# Patient Record
Sex: Male | Born: 1985 | Race: White | Hispanic: No | Marital: Single | Smoking: Current every day smoker
Health system: Southern US, Community
[De-identification: ages and names within clinical notes are randomized; demographics above are authoritative.]

## PROBLEM LIST (undated history)

## (undated) HISTORY — PX: NO PAST SURGERIES: SHX2092

---
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
## 2004-12-07 ENCOUNTER — Ambulatory Visit: Payer: Self-pay | Admitting: Internal Medicine

## 2005-01-16 ENCOUNTER — Emergency Department (HOSPITAL_COMMUNITY): Admission: EM | Admit: 2005-01-16 | Discharge: 2005-01-16 | Payer: Self-pay | Admitting: Emergency Medicine

## 2005-02-14 ENCOUNTER — Emergency Department (HOSPITAL_COMMUNITY): Admission: EM | Admit: 2005-02-14 | Discharge: 2005-02-14 | Payer: Self-pay | Admitting: Emergency Medicine

## 2005-08-20 ENCOUNTER — Emergency Department (HOSPITAL_COMMUNITY): Admission: EM | Admit: 2005-08-20 | Discharge: 2005-08-20 | Payer: Self-pay | Admitting: Emergency Medicine

## 2005-08-28 ENCOUNTER — Emergency Department (HOSPITAL_COMMUNITY): Admission: EM | Admit: 2005-08-28 | Discharge: 2005-08-29 | Payer: Self-pay | Admitting: Emergency Medicine

## 2005-09-01 ENCOUNTER — Emergency Department (HOSPITAL_COMMUNITY): Admission: EM | Admit: 2005-09-01 | Discharge: 2005-09-01 | Payer: Self-pay | Admitting: Family Medicine

## 2005-09-03 ENCOUNTER — Emergency Department (HOSPITAL_COMMUNITY): Admission: EM | Admit: 2005-09-03 | Discharge: 2005-09-03 | Payer: Self-pay | Admitting: Family Medicine

## 2006-07-04 ENCOUNTER — Emergency Department (HOSPITAL_COMMUNITY): Admission: EM | Admit: 2006-07-04 | Discharge: 2006-07-04 | Payer: Self-pay | Admitting: Emergency Medicine

## 2006-10-28 ENCOUNTER — Emergency Department (HOSPITAL_COMMUNITY): Admission: EM | Admit: 2006-10-28 | Discharge: 2006-10-28 | Payer: Self-pay | Admitting: Emergency Medicine

## 2007-08-03 ENCOUNTER — Emergency Department (HOSPITAL_COMMUNITY): Admission: EM | Admit: 2007-08-03 | Discharge: 2007-08-03 | Payer: Self-pay | Admitting: Emergency Medicine

## 2007-09-01 ENCOUNTER — Emergency Department (HOSPITAL_COMMUNITY): Admission: EM | Admit: 2007-09-01 | Discharge: 2007-09-01 | Payer: Self-pay | Admitting: *Deleted

## 2008-05-17 ENCOUNTER — Emergency Department (HOSPITAL_COMMUNITY): Admission: EM | Admit: 2008-05-17 | Discharge: 2008-05-17 | Payer: Self-pay | Admitting: Emergency Medicine

## 2009-06-03 ENCOUNTER — Emergency Department (HOSPITAL_COMMUNITY): Admission: EM | Admit: 2009-06-03 | Discharge: 2009-06-04 | Payer: Self-pay

## 2009-06-15 ENCOUNTER — Emergency Department (HOSPITAL_COMMUNITY): Admission: EM | Admit: 2009-06-15 | Discharge: 2009-06-15 | Payer: Self-pay | Admitting: Emergency Medicine

## 2009-09-23 ENCOUNTER — Emergency Department (HOSPITAL_COMMUNITY): Admission: EM | Admit: 2009-09-23 | Discharge: 2009-09-23 | Payer: Self-pay | Admitting: Emergency Medicine

## 2010-12-21 ENCOUNTER — Emergency Department (HOSPITAL_COMMUNITY): Payer: Self-pay

## 2010-12-21 ENCOUNTER — Encounter (HOSPITAL_COMMUNITY): Payer: Self-pay | Admitting: Radiology

## 2010-12-21 ENCOUNTER — Emergency Department (HOSPITAL_COMMUNITY)
Admission: EM | Admit: 2010-12-21 | Discharge: 2010-12-21 | Discharge status: Against Medical Advice | Payer: Self-pay | Attending: Emergency Medicine | Admitting: Emergency Medicine

## 2010-12-21 DIAGNOSIS — IMO0002 Reserved for concepts with insufficient information to code with codable children: Secondary | ICD-10-CM | POA: Insufficient documentation

## 2010-12-21 DIAGNOSIS — R109 Unspecified abdominal pain: Secondary | ICD-10-CM | POA: Insufficient documentation

## 2010-12-21 LAB — DIFFERENTIAL
Basophils Relative: 0 % (ref 0–1)
Eosinophils Absolute: 0.1 10*3/uL (ref 0.0–0.7)
Eosinophils Relative: 1 % (ref 0–5)
Monocytes Relative: 8 % (ref 3–12)
Neutrophils Relative %: 75 % (ref 43–77)

## 2010-12-21 LAB — CBC
MCH: 29.8 pg (ref 26.0–34.0)
Platelets: 290 10*3/uL (ref 150–400)
RBC: 5.07 MIL/uL (ref 4.22–5.81)
RDW: 12.4 % (ref 11.5–15.5)
WBC: 9.2 10*3/uL (ref 4.0–10.5)

## 2010-12-21 LAB — POCT I-STAT, CHEM 8
BUN: 10 mg/dL (ref 6–23)
Calcium, Ion: 1.22 mmol/L (ref 1.12–1.32)
HCT: 46 % (ref 39.0–52.0)
Hemoglobin: 15.6 g/dL (ref 13.0–17.0)
Sodium: 141 mEq/L (ref 135–145)
TCO2: 26 mmol/L (ref 0–100)

## 2010-12-21 LAB — HEPATIC FUNCTION PANEL
Albumin: 4.1 g/dL (ref 3.5–5.2)
Alkaline Phosphatase: 70 U/L (ref 39–117)
Indirect Bilirubin: 0.4 mg/dL (ref 0.3–0.9)
Total Protein: 6.8 g/dL (ref 6.0–8.3)

## 2010-12-21 LAB — URINE MICROSCOPIC-ADD ON

## 2010-12-21 LAB — URINALYSIS, ROUTINE W REFLEX MICROSCOPIC
Leukocytes, UA: NEGATIVE
Nitrite: NEGATIVE
Specific Gravity, Urine: 1.021 (ref 1.005–1.030)
Urobilinogen, UA: 1 mg/dL (ref 0.0–1.0)
pH: 7.5 (ref 5.0–8.0)

## 2010-12-21 LAB — LIPASE, BLOOD: Lipase: 24 U/L (ref 11–59)

## 2010-12-21 MED ORDER — IOHEXOL 300 MG/ML  SOLN: 100.0000 mL | Freq: Once | INTRAMUSCULAR | Status: DC | PRN

## 2011-02-18 LAB — CBC
Hemoglobin: 15.3 g/dL (ref 13.0–17.0)
MCHC: 34.7 g/dL (ref 30.0–36.0)
MCV: 91.3 fL (ref 78.0–100.0)
RBC: 4.81 MIL/uL (ref 4.22–5.81)
WBC: 7.7 10*3/uL (ref 4.0–10.5)

## 2011-02-18 LAB — TYPE AND SCREEN: Antibody Screen: NEGATIVE

## 2011-02-18 LAB — LACTIC ACID, PLASMA: Lactic Acid, Venous: 3 mmol/L — ABNORMAL HIGH (ref 0.5–2.2)

## 2011-05-30 ENCOUNTER — Encounter: Payer: Self-pay | Admitting: Internal Medicine

## 2011-05-30 ENCOUNTER — Emergency Department (HOSPITAL_COMMUNITY): Payer: Self-pay

## 2011-05-30 ENCOUNTER — Inpatient Hospital Stay (HOSPITAL_COMMUNITY)
Admission: EM | Admit: 2011-05-30 | Discharge: 2011-06-04 | DRG: 479 | Disposition: A | Discharge status: Home / Self Care | Payer: Self-pay | Attending: Internal Medicine | Admitting: Internal Medicine

## 2011-05-30 DIAGNOSIS — M899 Disorder of bone, unspecified: Principal | ICD-10-CM | POA: Diagnosis present

## 2011-05-30 DIAGNOSIS — F17200 Nicotine dependence, unspecified, uncomplicated: Secondary | ICD-10-CM | POA: Diagnosis present

## 2011-05-30 DIAGNOSIS — Z111 Encounter for screening for respiratory tuberculosis: Secondary | ICD-10-CM

## 2011-05-30 DIAGNOSIS — I498 Other specified cardiac arrhythmias: Secondary | ICD-10-CM | POA: Diagnosis present

## 2011-05-30 DIAGNOSIS — M545 Low back pain, unspecified: Secondary | ICD-10-CM | POA: Diagnosis present

## 2011-05-30 DIAGNOSIS — J479 Bronchiectasis, uncomplicated: Secondary | ICD-10-CM | POA: Diagnosis present

## 2011-05-30 DIAGNOSIS — G8929 Other chronic pain: Secondary | ICD-10-CM | POA: Diagnosis present

## 2011-05-30 DIAGNOSIS — Z560 Unemployment, unspecified: Secondary | ICD-10-CM

## 2011-05-30 LAB — CBC
Hemoglobin: 14.1 g/dL (ref 13.0–17.0)
Platelets: 250 10*3/uL (ref 150–400)
RBC: 4.6 MIL/uL (ref 4.22–5.81)
WBC: 10.6 10*3/uL — ABNORMAL HIGH (ref 4.0–10.5)

## 2011-05-30 LAB — COMPREHENSIVE METABOLIC PANEL
ALT: 11 U/L (ref 0–53)
Alkaline Phosphatase: 77 U/L (ref 39–117)
CO2: 29 mEq/L (ref 19–32)
Calcium: 8.8 mg/dL (ref 8.4–10.5)
GFR calc Af Amer: >60 mL/min (ref >60)
GFR calc non Af Amer: >60 mL/min (ref >60)
Glucose, Bld: 80 mg/dL (ref 70–99)
Potassium: 3.8 mEq/L (ref 3.5–5.1)
Sodium: 138 mEq/L (ref 135–145)

## 2011-05-30 LAB — URINALYSIS, ROUTINE W REFLEX MICROSCOPIC
Glucose, UA: NEGATIVE mg/dL
Hgb urine dipstick: NEGATIVE
Leukocytes, UA: NEGATIVE
Protein, ur: NEGATIVE mg/dL
Specific Gravity, Urine: 1.026 (ref 1.005–1.030)
Urobilinogen, UA: 1 mg/dL (ref 0.0–1.0)

## 2011-05-30 LAB — DIFFERENTIAL
Basophils Relative: 0 % (ref 0–1)
Eosinophils Absolute: 0.3 10*3/uL (ref 0.0–0.7)
Lymphs Abs: 1.9 10*3/uL (ref 0.7–4.0)
Neutro Abs: 7.6 10*3/uL (ref 1.7–7.7)
Neutrophils Relative %: 72 % (ref 43–77)

## 2011-05-30 NOTE — H&P (Signed)
Hospital Admission Note Date: 05/30/2011  Patient name: Jeffrey Wise Medical record number: [id number] Date of birth: [birth year] Age: 25 y.o. Gender: male PCP: No primary provider on file.  Medical Service: [facility] Teaching Service  Attending physician: Dr. Josem Kaufmann   Pager: Resident (R2/R3): Dr. Laurel Dimmer   Pager: [phone] Resident (R1): Dr. Vernice Jefferson  Pager: [phone]  Chief Complaint: Back Pain  History of Present Illness:    Allergies: Review of patient's allergies indicates no known allergies.  PMH: none  Past Surgical History: none  Family History:  Mom (alive) - hip dysplasia, asthma, emphysema, bronchitis Dad (alive) - doesn't know  Maternal Uncle: 2 brain tumors 2 older brothers: healthy  History   Social History  . Marital Status: Single    Spouse Name: N/A    Number of Children: N/A  . Years of Education: Dropped out in 9th grade, right before 15th birthday; interested in going back to school, but can't afford it   Occupational History  . Outdoors work: Systems developer   Social History Main Topics  . Smoking status: Current, 0.5-1 PPD x 12 years  . Smokeless tobacco: Not on file  . Alcohol Use: Occasionally, beer  . Drug Use: Denies cocaine/heroin/marijuana  . Sexually Active: Yes, women    Review of Systems: Pertinent items are noted in HPI.  Physical Exam:  There were no vitals filed for this visit.    Lab results:  Imaging results:   Other results:  Assessment & Plan by Problem:
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
## 2011-05-31 ENCOUNTER — Inpatient Hospital Stay (HOSPITAL_COMMUNITY): Payer: Self-pay

## 2011-05-31 DIAGNOSIS — M545 Low back pain: Secondary | ICD-10-CM

## 2011-05-31 MED ORDER — IOHEXOL 300 MG/ML  SOLN
80.0000 mL | Freq: Once | INTRAMUSCULAR | Status: AC | PRN
  Administered 2011-05-31: 80 mL via INTRAVENOUS

## 2011-06-03 LAB — CBC
MCH: 31.2 pg (ref 26.0–34.0)
MCV: 87 fL (ref 78.0–100.0)
Platelets: 229 10*3/uL (ref 150–400)
RDW: 12.5 % (ref 11.5–15.5)

## 2011-06-03 LAB — BASIC METABOLIC PANEL
Calcium: 9.8 mg/dL (ref 8.4–10.5)
Creatinine, Ser: 1.19 mg/dL (ref 0.50–1.35)
GFR calc Af Amer: >60 mL/min (ref >60)

## 2011-06-04 ENCOUNTER — Other Ambulatory Visit: Payer: Self-pay | Admitting: Interventional Radiology

## 2011-06-04 ENCOUNTER — Inpatient Hospital Stay (HOSPITAL_COMMUNITY): Payer: Self-pay

## 2011-06-04 DIAGNOSIS — M545 Low back pain: Secondary | ICD-10-CM

## 2011-06-05 NOTE — Consult Note (Signed)
NAMEKASCH, BORQUEZ                ACCOUNT NO.:  [id number]  MEDICAL RECORD NO.:  [id number]  LOCATION:  [facility]                         FACILITY:  [facility]  PHYSICIAN:  Cristi Loron, M.D.DATE OF BIRTH:  21-Jul-1986  DATE OF CONSULTATION:  05/30/2011 DATE OF DISCHARGE:                                CONSULTATION   BRIEF HISTORY:  The patient is a 25 year old white male, who has had some chronic back and belly pain.  In fact, he was seen in the [facility] back in February where he underwent a CT of his abdomen largely secondary to some abdominal pain he was having. The workup demonstrated he had an L4 lesion and the ER physician recommend further workup with lumbar MRI.  The patient refused this and left AMA.  The patient has had a progressing increasing back pain and came back to the ER today.  He was evaluated by the emergency room staff to include a lumbar MRI, which demonstrated this L4 lesion had enlarged a bit and the patient is to be admitted by the medical service for further workup and a neurosurgical consultation was requested.  Presently, the patient is accompanied by his mother.  He complains of midline low back pain at approximately the L4 region, left greater than right.  It does not radiate into his legs.  He occasionally gets pain into his right hip.  He denies lower extremity numbness, tingling, weakness, etc.  There is no history of cancer in the patient seen.  PAST MEDICAL HISTORY:  Negative.  PAST SURGICAL HISTORY:  None.  MEDICATIONS:  Prior to admission, none.  ALLERGIES:  He has no known drug allergies.  FAMILY HISTORY:  The patient's mother aged 46, she has had "male cancer" lung cancer, breast cancer, colon cancer running in the patient's maternal side.  SOCIAL HISTORY:  The patient is single.  He has two children (age 79 and 1).  He is currently unemployed.  He occasionally drinks alcohol.  He denies drug use.  He  smokes one-pack day of cigarettes.  REVIEW OF SYSTEMS:  Negative except as above.  PHYSICAL EXAMINATION:  GENERAL:  A pleasant healthy appearing 25 year old white male, who complains of back pain. HEENT:  Normocephalic, atraumatic.  His pupils equal, round, and reactive to light.  Extraocular muscles intact.  Oropharynx benign. NECK:  Supple.  There is no masses, deformity, or tracheal deviation. He has a normal cervical range of motion. THORAX:  Symmetric. ABDOMEN:  Soft. EXTREMITIES:  No obvious deformities. BACK:  He has some tenderness to palpation diffusely in the region of L4.  No point tenderness per se.  No deformities. NEUROLOGIC:  The patient is alert and oriented x3.  Cranial nerves II through XII examined grossly normal.  Vision and hearing grossly normal bilaterally.  Motor strength is 5/5 with mild deltoid, biceps, triceps, handgrip, psoas, quadriceps, gastrocnemius, dorsiflexors.  His deep tendon reflexes are 2/4 biceps, triceps, quadriceps, gastrocnemius. There is no ankle clonus.  Sensory function is intact to light touch. Sensation all tested dermatomes bilaterally.  IMAGING STUDIES:  I reviewed the patient's abdominal CT performed in February 2002, which demonstrates an L4 lesion.  Also the patient's lumbar MRI performed today and again demonstrates a lesion within the patient's L4 vertebral body.  There is no significant nerve compression.  ASSESSMENT AND PLAN:  L4 lesion.  I have discussed the situation with the patient and his mother.  He is going to be admitted for further workup.  At this point, I would recommend that he get a needle biopsy of his L4 lesion and CT of his chest, abdomen, and pelvis, look for primary lesion as primary spine tumors are somewhat common.  I will follow the patient with you.  I gave the patient my card and answered all his questions.     Cristi Loron, M.D.     JDJ/MEDQ  D:  05/30/2011  T:  05/30/2011  Job:   [id number]  Electronically Signed by Tressie Stalker M.D. on 06/05/2011 06:27:46 PM

## 2011-06-07 LAB — CULTURE, ROUTINE-ABSCESS
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
## 2011-06-25 ENCOUNTER — Encounter: Payer: Self-pay | Admitting: Internal Medicine

## 2011-06-25 NOTE — Progress Notes (Signed)
06/25/11  Patient was called last week & informed to schedule an appointment at the [facility].  He still has not seen the neurosurgeon regarding L4 lesion, and he is still currently in pain.    At this follow up appointment, we should 1. 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 Consider RPR for syphilis given plasma cells seen on surgical pathology. 3. Multiple myeloma is unlikely given age, CMP (normal total protein, blood albumin & calcium), so further work up for this is probably not necessary at this point. 4. F/u TB PCR which was requested on June 14, 2011 5. Consider scheduling for second biopsy of lesion.

## 2011-07-01 LAB — FUNGUS CULTURE W SMEAR: Fungal Smear: NONE SEEN

## 2011-07-04 ENCOUNTER — Ambulatory Visit (HOSPITAL_COMMUNITY)
Admission: RE | Admit: 2011-07-04 | Discharge: 2011-07-04 | Disposition: A | Discharge status: Home / Self Care | Payer: Self-pay | Source: Ambulatory Visit | Attending: Neurosurgery | Admitting: Neurosurgery

## 2011-07-04 ENCOUNTER — Other Ambulatory Visit (HOSPITAL_COMMUNITY): Payer: Self-pay | Admitting: Neurosurgery

## 2011-07-04 DIAGNOSIS — D492 Neoplasm of unspecified behavior of bone, soft tissue, and skin: Secondary | ICD-10-CM

## 2011-07-04 DIAGNOSIS — M519 Unspecified thoracic, thoracolumbar and lumbosacral intervertebral disc disorder: Secondary | ICD-10-CM | POA: Insufficient documentation

## 2011-07-04 MED ORDER — GADOBENATE DIMEGLUMINE 529 MG/ML IV SOLN
15.0000 mL | Freq: Once | INTRAVENOUS | Status: AC
  Administered 2011-07-04: 15 mL via INTRAVENOUS

## 2011-07-21 LAB — AFB CULTURE WITH SMEAR (NOT AT ARMC)

## 2011-07-25 ENCOUNTER — Encounter: Payer: Self-pay | Admitting: Internal Medicine

## 2011-07-25 ENCOUNTER — Ambulatory Visit (INDEPENDENT_AMBULATORY_CARE_PROVIDER_SITE_OTHER): Payer: Self-pay | Admitting: Internal Medicine

## 2011-07-25 VITALS — BP 126/64 | HR 75 | Temp 97.6°F | Ht 69.0 in | Wt 137.6 lb

## 2011-07-25 DIAGNOSIS — G8929 Other chronic pain: Secondary | ICD-10-CM

## 2011-07-25 DIAGNOSIS — M549 Dorsalgia, unspecified: Secondary | ICD-10-CM

## 2011-07-25 LAB — CBC
HCT: 41.5 % (ref 39.0–52.0)
Hemoglobin: 14.3 g/dL (ref 13.0–17.0)
MCHC: 34.5 g/dL (ref 30.0–36.0)
RBC: 4.72 MIL/uL (ref 4.22–5.81)
WBC: 8.5 10*3/uL (ref 4.0–10.5)

## 2011-07-25 NOTE — Patient Instructions (Signed)
-  Please follow up with Dr. York Ram regarding pain management.    -Please try to keep a log of when you use your pain medications.   -Please call our clinic at [phone] should you develop fever, if you develop new symptoms, or if your current symptoms currently worsen.    -Please follow up with [place] in 2-3 months.  We will call you if lab results from today's visit are abnormal.

## 2011-07-25 NOTE — Progress Notes (Signed)
I agree with assessment and plan as per Dr. Kapadia. 

## 2011-07-25 NOTE — Progress Notes (Signed)
  Subjective:    [name], male    DOB: 1986-02-25, 25 y.o.   MRN: [id number]  HPI  25 yo man presents for hospital follow up regarding L4 back lesion, for which he was hospitalized in July 2012.  He has since seen his neurosurgeon, Dr. Lovell Sheehan, twice.  He has had an interval MRI that did not suggest significant change.  He reports that Dr. Lovell Sheehan will order a repeat MRI in 3 months to re-assess, as he thinks it may be 2/2 infection at this time.  He notes that Dr. Lovell Sheehan suggested that there is no role for repeat biopsy at this time.    Regarding pain, he notes that the pain is 7-8/10 currently, 10/10 at its worst and 3-4/10 when appropriately managed with percocet.  The pain is sharp, throbbing, constant, & non-radiating except when he coughs or sneezes, at which point the pain shoots up his back.  Sitting or laying down worsens the pain, and for that reason, the pain is at its worst in the morning.  Pain is improved with walking & percocet.  He notes that he uses 2 percocet tabs/day, which he is being prescribed by Dr. Lovell Sheehan.   Of note, he endorses some mild tingling in bilateral heels. Denies urinary or fecal incontinence.  No difficulty with erection/ejaculation.  Denies numbness.    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    Review of Systems General: no fevers, chills, changes in weight, changes in appetite Skin: no rash HEENT: no blurry vision, hearing changes, sore throat Pulm: no dyspnea, coughing, wheezing CV: no chest pain, palpitations, shortness of breath Abd: no abdominal pain, nausea/vomiting, he notes increased frequency of BM since hospital discharge such that he has to have a BM as soon as he eats/drinks anything, which is occasionally diarrhea, but usually formed stools GU: no dysuria, hematuria, polyuria Ext: no arthralgias, myalgias Neuro: no weakness, numbness, or tingling    Objective:   Physical  Exam  Vitals: reviewed  General: sitting in chair, walks slowly 2/2 back pain HEENT: PERRL, EOMI, no scleral icterus Cardiac: RRR, no rubs, murmurs or gallops Pulm: clear to auscultation bilaterally, moving normal volumes of air, no wheezes/rales/rhonchi Abd: soft, nontender, nondistended, BS normoactive Ext: warm and well perfused, no pedal edema Neuro: alert and oriented X3, cranial nerves II-XII grossly intact, strength and sensation to light touch equal in bilateral upper and lower extremities, rectal tone in tact Genital: no scrotal mass Back: mild redness surrounding lower back where L4 lesion is (about 5-6 cm in diameter), not significantly warmer to touch compared to other areas of body, not tender to palpation, no mass noted externally (patient does work outdoors, but notes that he keeps his shirt on while working and only gets burned on the back of his neck)     Assessment & Plan:  Case & care were discussed with Dr. Coralee Pesa. Please see problem oriented charting for further detail. Patient to follow up in about 3 months. Notes from Dr. Lovell Sheehan' office were obtained at this visit.  - He notes that if lesion continues to enlarge in 3 months, that he may require a vertebrectomy with instrumentation & fusion. Suggested that patient use sunscreen when working outdoors.

## 2011-07-25 NOTE — Assessment & Plan Note (Addendum)
Patient was seen in the hospital in July 2012 regarding severe low back pain.  Imaging at that time revealed an L4 lesion that had enlarged since February 2012.  Workup during hospitalization did not suggest etiology.  All surgical pathology, cultures (including fungal and yeast) as well as cytology were negative with the exception of reactive lamellar bone and inflamed fibrous tissue with multinucleated giant cells that may have been reactive to biopsy trauma.    Repeat MRI was done on Jul 04, 2011 that did not suggest interval change of lesion size.  Given these findings, Dr. Lovell Sheehan thought it prudent to wait and watch and repeat MRI in 3 months to re-evaluate, as the lesion still may be 2/2 infection.  Given back redness on exam, CBC will be done today.  Back MRI also suggested b/l sacroillitis.    The possibility of testicular Ca was considered, as that may commonly metastasize to the vertebra, but physical exam was negative for scrotal mass. Also, I assessed neurological function by performing a rectal exam, which was normal.    Although patient's CMET was WNL during hospitalization, since we still do not have an etiology for his back lesion, I will obtain both a SPEP & UPEP to r/o multiple myeloma.    Regarding pain, patient has been given pain medications by Dr. Lovell Sheehan. He was given 50 tabs of vicodin 10-325 that he finished in 1 week, and then subsequently was given Percocet 10/325 #100 q5 hours on 07/11/11, which he completed yesterday, lasting 2 weeks.  Dr. York Ram note suggests that he plans to change him over to hydrocodone in a few weeks.  Given this plan, I am deferring pain management to him at this time.  I did ask patient to be sure to log the use of his pain medications because initially reported taking 2 tabs/day, which doesn't explain how he has completed all the pain medications he has been given.  He suggested that he may either not remember how much and how often he takes medications  or maybe somebody is taking them.  I also suggested that he lock away his pain medications.  I expressed the importance of being sure to monitor how much medication he is using given that these are strong drugs and also, clinically, we need to know how much medication he requires to adequately treat his pain.  Plan: Follow up with Dr. Lovell Sheehan regarding repeat MRI in 3 months.  Follow up with Dr. Lovell Sheehan for pain mgmt - Dr. York Ram office was called today during appointment to inform that we are deferring pain mgmt to them at this time. Follow up with [facility] clinic in 3 months, we will call if lab results are abnormal, and he should call us if he develops new or worsening symptoms.  08/03/11: SPEP & UPEP do not suggest MM.  Last MRI suggests sacroillitis.  I would like to check blood for HLA-B27, Vit D (given schmorl's node on MRI), and CRP (recommended as per uptodate, ESR was WNL during hospitalization, but uptodate suggests ESR may not be elevated).  I would also like to try him on Naproxen 500mg  PO BID for pain management (uptodate, max dose is 1500mg /day x 5mo, as per uptodate, he should be given a sustained dose on a regular basis for at least 4 weeks).  I called his cell and home numbers.  No answer on cell. I spoke with April Knot (sister in law) at home number and left msg for him to call clinic  to come for blood work.  I will try to call again regarding pain management.  I will place orders for labs today.  I discussed this plan with Dr. Coralee Pesa.

## 2011-07-27 LAB — PROTEIN ELECTROPHORESIS, SERUM
Alpha-2-Globulin: 14.5 % — ABNORMAL HIGH (ref 7.1–11.8)
Beta Globulin: 5.1 % (ref 4.7–7.2)
Gamma Globulin: 10.3 % — ABNORMAL LOW (ref 11.1–18.8)
Total Protein, Serum Electrophoresis: 6.9 g/dL (ref 6.0–8.3)

## 2011-07-27 LAB — UIFE/LIGHT CHAINS/TP QN, 24-HR UR
Alpha 2, Urine: DETECTED — AB
Beta, Urine: DETECTED — AB

## 2011-08-03 NOTE — Progress Notes (Signed)
Addended by: Alanson Puls on: 08/03/2011 02:30 PM   Modules accepted: Orders

## 2011-09-26 ENCOUNTER — Encounter: Payer: Self-pay | Admitting: *Deleted

## 2011-09-27 ENCOUNTER — Other Ambulatory Visit (HOSPITAL_COMMUNITY): Payer: Self-pay | Admitting: Neurosurgery

## 2011-09-27 DIAGNOSIS — M549 Dorsalgia, unspecified: Secondary | ICD-10-CM

## 2011-10-01 ENCOUNTER — Ambulatory Visit (HOSPITAL_COMMUNITY)
Admission: RE | Admit: 2011-10-01 | Discharge: 2011-10-01 | Disposition: A | Discharge status: Home / Self Care | Payer: Self-pay | Source: Ambulatory Visit | Attending: Neurosurgery | Admitting: Neurosurgery

## 2011-10-01 DIAGNOSIS — M549 Dorsalgia, unspecified: Secondary | ICD-10-CM | POA: Insufficient documentation

## 2011-10-01 MED ORDER — GADOBENATE DIMEGLUMINE 529 MG/ML IV SOLN
14.0000 mL | Freq: Once | INTRAVENOUS | Status: AC
  Administered 2011-10-01: 14 mL via INTRAVENOUS

## 2011-11-14 ENCOUNTER — Encounter: Payer: Self-pay | Admitting: Internal Medicine

## 2011-11-20 ENCOUNTER — Encounter: Payer: Self-pay | Attending: Physical Medicine & Rehabilitation

## 2011-11-20 ENCOUNTER — Ambulatory Visit: Payer: Self-pay | Admitting: Physical Medicine & Rehabilitation

## 2012-02-07 ENCOUNTER — Other Ambulatory Visit (HOSPITAL_COMMUNITY): Payer: Self-pay | Admitting: Neurosurgery

## 2012-02-07 DIAGNOSIS — IMO0002 Reserved for concepts with insufficient information to code with codable children: Secondary | ICD-10-CM

## 2012-02-11 ENCOUNTER — Other Ambulatory Visit (HOSPITAL_COMMUNITY): Payer: Self-pay

## 2012-05-30 ENCOUNTER — Other Ambulatory Visit (HOSPITAL_COMMUNITY): Payer: Self-pay | Admitting: Neurosurgery

## 2012-05-30 DIAGNOSIS — M549 Dorsalgia, unspecified: Secondary | ICD-10-CM

## 2012-06-04 ENCOUNTER — Inpatient Hospital Stay (HOSPITAL_COMMUNITY): Admission: RE | Admit: 2012-06-04 | Payer: Self-pay | Source: Ambulatory Visit

## 2012-10-20 ENCOUNTER — Other Ambulatory Visit (HOSPITAL_COMMUNITY): Payer: Self-pay | Admitting: Neurosurgery

## 2013-05-06 IMAGING — CT CT CHEST W/ CM
2 of 5 series · 17 of 46 positions shown, 19 images · IV contrast (APPLIED)
Comparison: Lumbar MRI dated May 30, 2011 and abdominal CT scan
dated December 21, 2010

CT CHEST

CLINICAL DATA: Lesion in L4 vertebral body; looking for primary
neoplasm

CT CHEST, ABDOMEN AND PELVIS WITH CONTRAST
TECHNIQUE: Multidetector CT imaging of the chest, abdomen and
pelvis was performed following the standard protocol during bolus
administration of intravenous contrast.
Contrast: 80 ml Omni 300

[Series 3: c/a/p 5.0 b31f · axial · 0.69mm/px · z∈[-545,-5]mm · 14 of 122 slices shown, 16 images]
[im 7/122  soft-tissue]
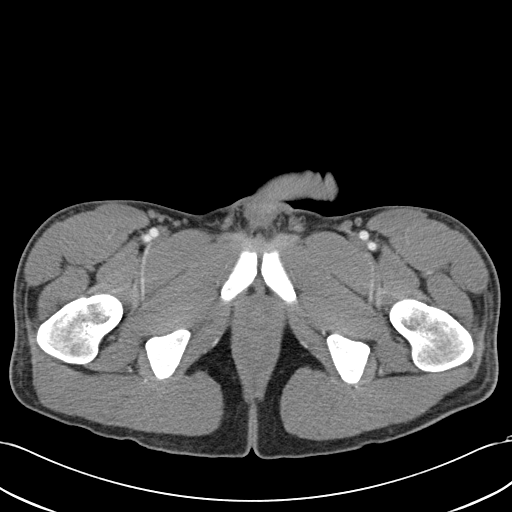
[im 7/122  bone]
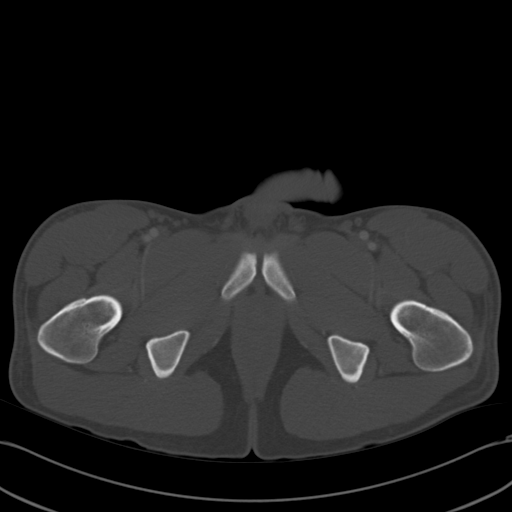
[im 13/122  soft-tissue]
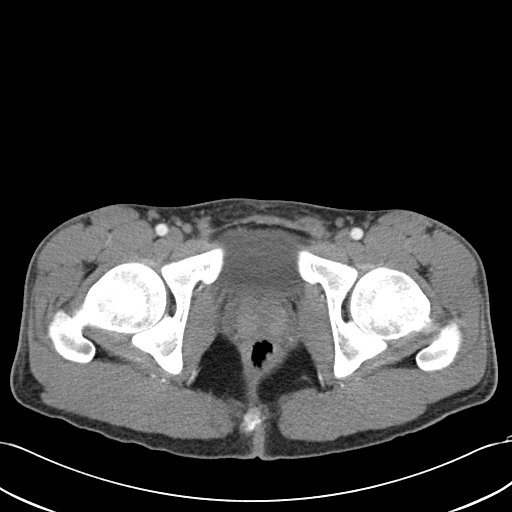
[im 26/122  soft-tissue]
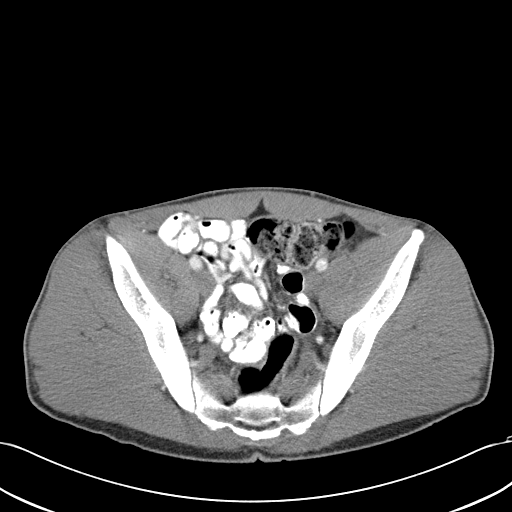
[im 32/122  soft-tissue]
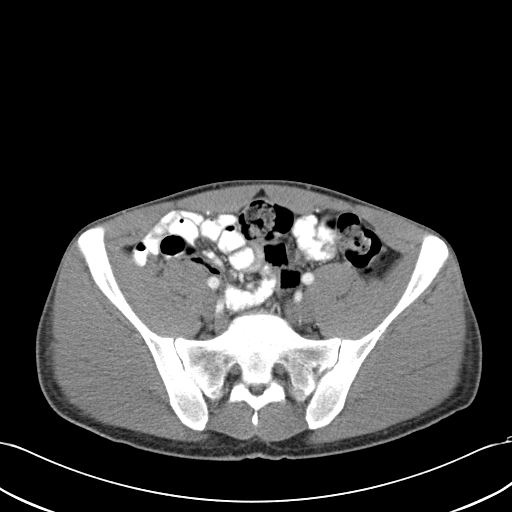
[im 39/122  soft-tissue]
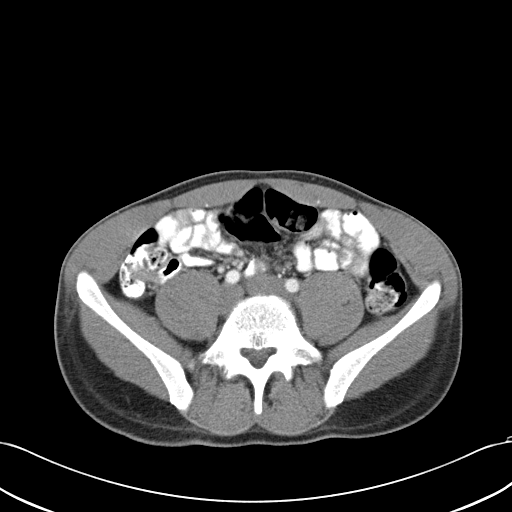
[im 51/122  soft-tissue]
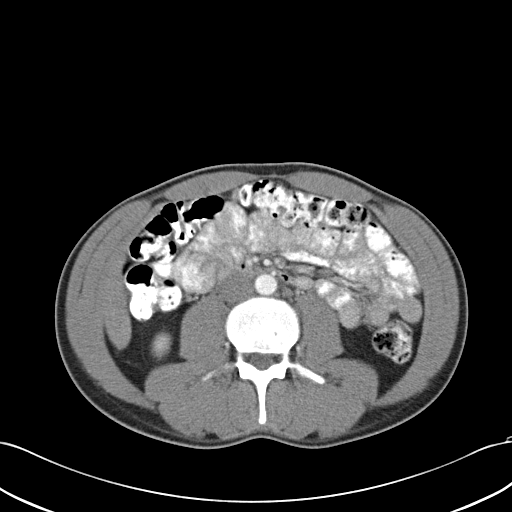
[im 58/122  soft-tissue]
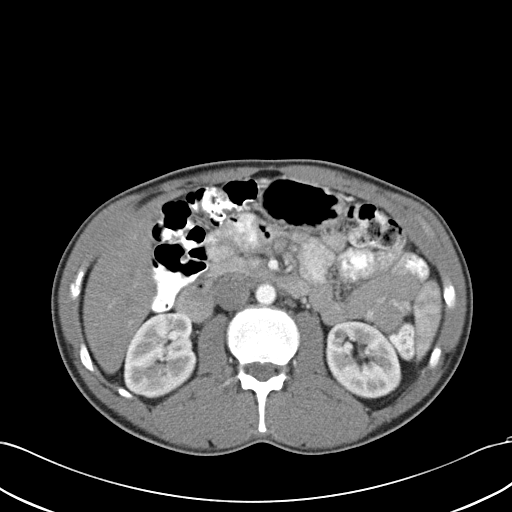
[im 64/122  soft-tissue]
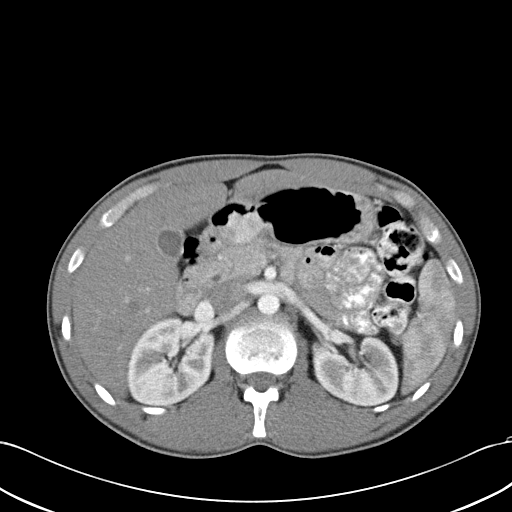
[im 71/122  soft-tissue]
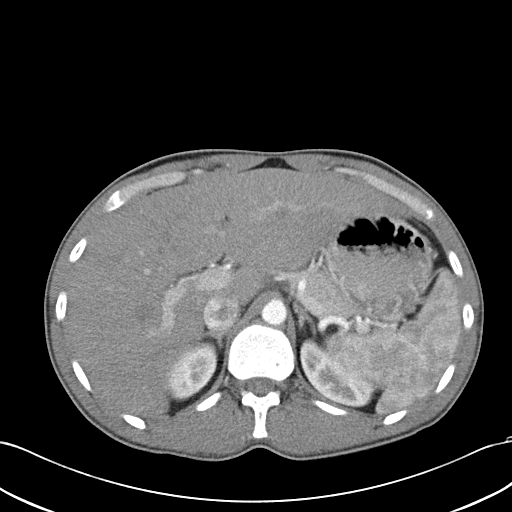
[im 71/122  bone]
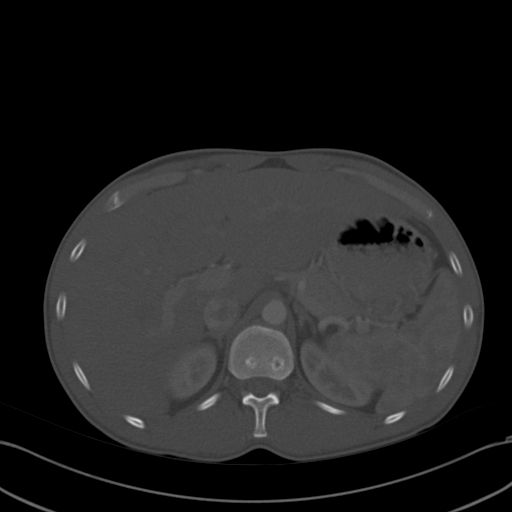
[im 83/122  soft-tissue]
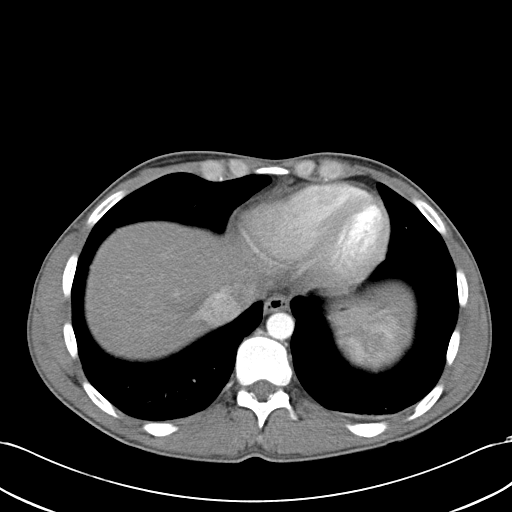
[im 90/122  soft-tissue]
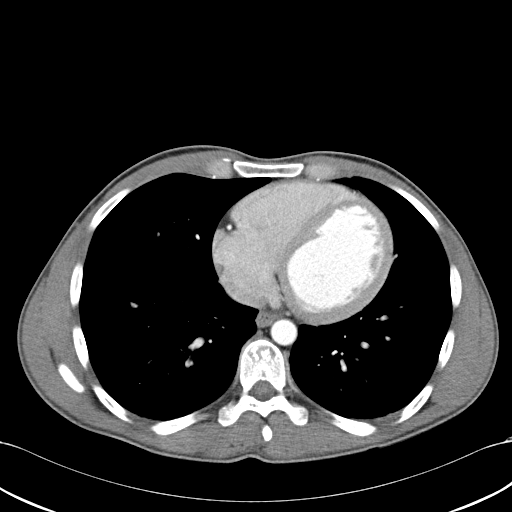
[im 96/122  soft-tissue]
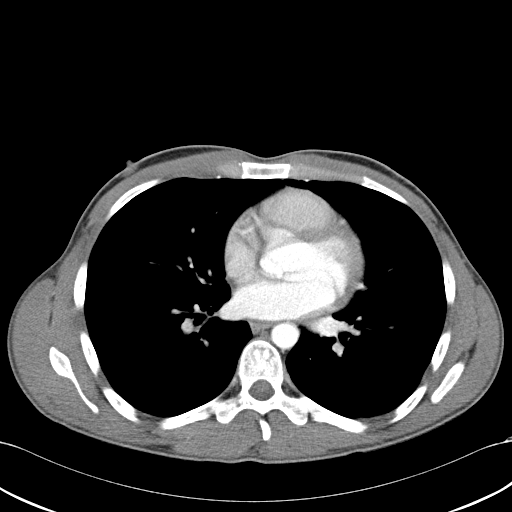
[im 109/122  soft-tissue]
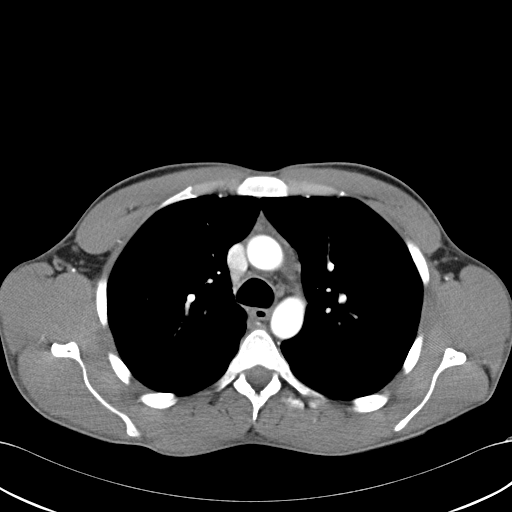
[im 115/122  soft-tissue]
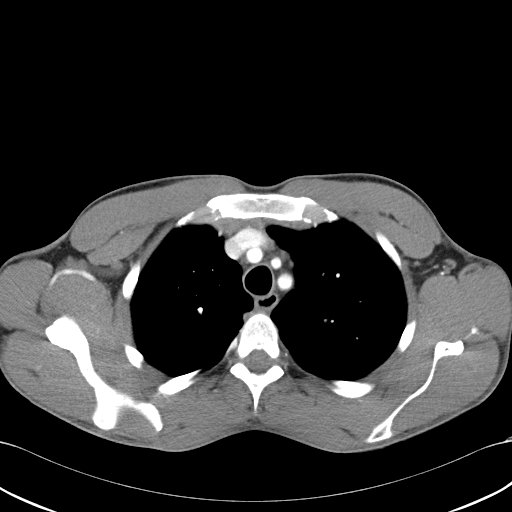

[Series 6: c/a/p cor cor · coronal · 1.53mm/px · 3 of 103 slices shown]
[im 35/103  soft-tissue]
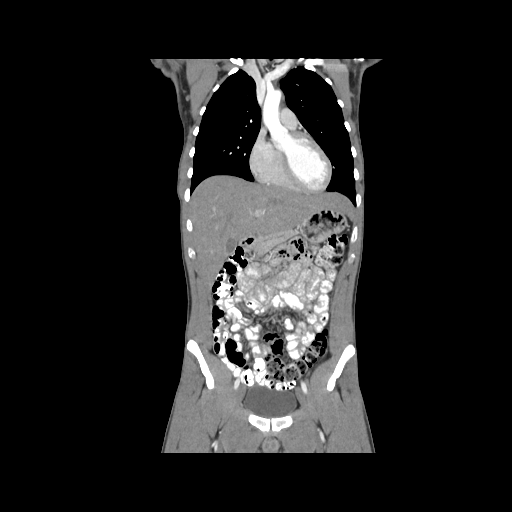
[im 46/103  soft-tissue]
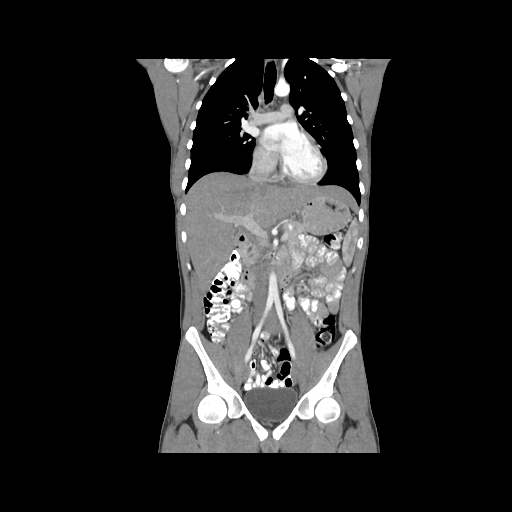
[im 57/103  soft-tissue]
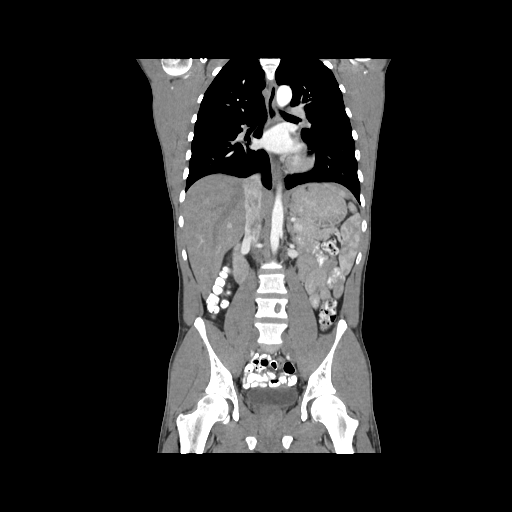

[17 of 46 positions shown; findings below may reference images not displayed]

FINDINGS: There is no axillary, hilar, mediastinal adenopathy.
There are no masses or infiltrates within either lung.  No pleural
effusions.  There is mild cystic bronchiectasis within both upper
lobes.
IMPRESSION: There is mild cystic bronchiectasis within both upper lobes which
can be seen most commonly with a history of tuberculosis or cystic
fibrosis.  No masses or adenopathy are present.

CT ABDOMEN AND PELVIS
FINDINGS: The liver, spleen, pancreas, adrenal glands, kidneys,
urinary bladder have a normal appearance.  The bowel is
unremarkable with no evidence of gross obstruction or inflammation.
There is no adenopathy or free fluid within the abdomen or pelvis.

The lytic lesion within the L4 vertebral body is larger now
compared with December 2010, measuring 3.1 cm in greatest
dimension.  The lesion extends to the anterior margin of the
vertebral body. No gross extraosseous soft tissue mass is
identified. No additional suspicious osseous lesions are
identified.
IMPRESSION: There has been slight interval enlargement in the destructive
appearing lytic lesion within the L4 vertebral body compared with
December 2010.  No additional suspicious osseous lesions are
identified.  There is no evidence of mass or adenopathy within the
abdomen or pelvis.  Given the findings of cystic apical
bronchiectasis in the chest, a tuberculous lesion could be
considered, along with  neoplastic process.

## 2013-08-10 ENCOUNTER — Encounter (HOSPITAL_COMMUNITY): Payer: Self-pay | Admitting: *Deleted

## 2013-08-10 DIAGNOSIS — F1410 Cocaine abuse, uncomplicated: Secondary | ICD-10-CM | POA: Insufficient documentation

## 2013-08-10 DIAGNOSIS — F17200 Nicotine dependence, unspecified, uncomplicated: Secondary | ICD-10-CM | POA: Insufficient documentation

## 2013-08-10 DIAGNOSIS — R231 Pallor: Secondary | ICD-10-CM | POA: Insufficient documentation

## 2013-08-10 DIAGNOSIS — M62838 Other muscle spasm: Secondary | ICD-10-CM | POA: Insufficient documentation

## 2013-08-10 NOTE — ED Notes (Signed)
Pt c/o neck pain radiating down to right shoulder x 1 wk; no known injury

## 2013-08-11 ENCOUNTER — Emergency Department (HOSPITAL_COMMUNITY): Payer: Self-pay

## 2013-08-11 ENCOUNTER — Emergency Department (HOSPITAL_COMMUNITY)
Admission: EM | Admit: 2013-08-11 | Discharge: 2013-08-11 | Disposition: A | Discharge status: Home / Self Care | Payer: Self-pay | Attending: Emergency Medicine | Admitting: Emergency Medicine

## 2013-08-11 DIAGNOSIS — M542 Cervicalgia: Secondary | ICD-10-CM

## 2013-08-11 DIAGNOSIS — M62838 Other muscle spasm: Secondary | ICD-10-CM

## 2013-08-11 DIAGNOSIS — F1410 Cocaine abuse, uncomplicated: Secondary | ICD-10-CM

## 2013-08-11 LAB — POCT I-STAT, CHEM 8
BUN: 19 mg/dL (ref 6–23)
Calcium, Ion: 1.27 mmol/L — ABNORMAL HIGH (ref 1.12–1.23)
Glucose, Bld: 76 mg/dL (ref 70–99)
HCT: 44 % (ref 39.0–52.0)
Hemoglobin: 15 g/dL (ref 13.0–17.0)
Sodium: 143 mEq/L (ref 135–145)
TCO2: 28 mmol/L (ref 0–100)

## 2013-08-11 LAB — CBC WITH DIFFERENTIAL/PLATELET
Basophils Absolute: 0.1 10*3/uL (ref 0.0–0.1)
Eosinophils Relative: 10 % — ABNORMAL HIGH (ref 0–5)
HCT: 42.6 % (ref 39.0–52.0)
Hemoglobin: 14.9 g/dL (ref 13.0–17.0)
Lymphocytes Relative: 25 % (ref 12–46)
MCHC: 35 g/dL (ref 30.0–36.0)
MCV: 88.9 fL (ref 78.0–100.0)
Monocytes Absolute: 0.8 10*3/uL (ref 0.1–1.0)
Monocytes Relative: 7 % (ref 3–12)
Neutro Abs: 6.2 10*3/uL (ref 1.7–7.7)
Neutrophils Relative %: 57 % (ref 43–77)
Platelets: 298 10*3/uL (ref 150–400)
RDW: 12.7 % (ref 11.5–15.5)
WBC: 10.7 10*3/uL — ABNORMAL HIGH (ref 4.0–10.5)

## 2013-08-11 LAB — RAPID URINE DRUG SCREEN, HOSP PERFORMED
Amphetamines: NOT DETECTED
Barbiturates: NOT DETECTED
Benzodiazepines: NOT DETECTED
Opiates: NOT DETECTED

## 2013-08-11 LAB — SEDIMENTATION RATE: Sed Rate: 2 mm/hr (ref 0–16)

## 2013-08-11 MED ORDER — SODIUM CHLORIDE 0.9 % IV SOLN
Freq: Once | INTRAVENOUS | Status: AC
  Administered 2013-08-11: 02:00:00 via INTRAVENOUS

## 2013-08-11 MED ORDER — METHOCARBAMOL 500 MG PO TABS: 500.0000 mg | ORAL_TABLET | Freq: Two times a day (BID) | ORAL | Status: DC

## 2013-08-11 MED ORDER — HYDROMORPHONE HCL PF 1 MG/ML IJ SOLN
1.0000 mg | Freq: Once | INTRAMUSCULAR | Status: AC
  Administered 2013-08-11: 1 mg via INTRAVENOUS

## 2013-08-11 MED ORDER — DIAZEPAM 5 MG/ML IJ SOLN
INTRAMUSCULAR | Status: AC
  Filled 2013-08-11: qty 2

## 2013-08-11 MED ORDER — DIAZEPAM 5 MG/ML IJ SOLN
5.0000 mg | Freq: Once | INTRAMUSCULAR | Status: AC
  Administered 2013-08-11: 5 mg via INTRAVENOUS

## 2013-08-11 MED ORDER — IBUPROFEN 600 MG PO TABS: 600.0000 mg | ORAL_TABLET | Freq: Four times a day (QID) | ORAL | Status: DC | PRN

## 2013-08-11 MED ORDER — HYDROMORPHONE HCL PF 1 MG/ML IJ SOLN
INTRAMUSCULAR | Status: AC
  Filled 2013-08-11: qty 1

## 2013-08-11 NOTE — ED Provider Notes (Signed)
Medical screening examination/treatment/procedure(s) were performed by non-physician practitioner and as supervising physician I was immediately available for consultation/collaboration.  Kassidie Hendriks L Leaann Nevils, MD 08/11/13 0441 

## 2013-08-11 NOTE — ED Provider Notes (Signed)
CSN: [id number]     Arrival date & time 08/10/13  2338 History   First MD Initiated Contact with Patient 08/11/13 [phone]     Chief Complaint  Patient presents with  . Neck Pain   (Consider location/radiation/quality/duration/timing/severity/associated sxs/prior Treatment) HPI Comments: Patient report, one week of generalized neck pain, radiating to his right shoulder.  He's tried applying heat, without resolution Denies any drug use   Patient is a 27 y.o. male presenting with neck pain. The history is provided by the patient.  Neck Pain Pain location:  Generalized neck Quality:  Aching Pain radiates to:  R scapula Pain severity:  Severe Onset quality:  Unable to specify Duration:  7 weeks Timing:  Constant Progression:  Unchanged Chronicity:  New Context: not fall, not jumping from heights, not lifting a heavy object, not MCA, not MVA, not pedestrian accident and not recent injury   Relieved by:  Nothing Worsened by:  Position Ineffective treatments:  Heat Associated symptoms: no fever, no headaches, no numbness, no paresis and no weakness     History reviewed. No pertinent past medical history. History reviewed. No pertinent past surgical history. No family history on file. History  Substance Use Topics  . Smoking status: Current Every Day Smoker -- 0.50 packs/day    Types: Cigarettes  . Smokeless tobacco: Not on file  . Alcohol Use: Not on file    Review of Systems  Constitutional: Negative for fever.  HENT: Positive for neck pain and neck stiffness. Negative for sore throat and trouble swallowing.   Musculoskeletal: Negative for back pain.  Skin: Negative for rash and wound.  Neurological: Negative for dizziness, weakness, numbness and headaches.  All other systems reviewed and are negative.    Allergies  Review of patient's allergies indicates no known allergies.  Home Medications   Current Outpatient Rx  Name  Route  Sig  Dispense  Refill  . ibuprofen  (ADVIL,MOTRIN) 600 MG tablet   Oral   Take 1 tablet (600 mg total) by mouth every 6 (six) hours as needed for pain.   30 tablet   0   . methocarbamol (ROBAXIN) 500 MG tablet   Oral   Take 1 tablet (500 mg total) by mouth 2 (two) times daily.   20 tablet   0    BP 135/71  Pulse 70  Temp(Src) 99.1 F (37.3 C) (Oral)  Resp 20  SpO2 99% Physical Exam  Nursing note and vitals reviewed. Constitutional: He is oriented to person, place, and time. He appears well-developed and well-nourished.  HENT:  Head: Normocephalic.  Eyes: Pupils are equal, round, and reactive to light.  Neck: Spinous process tenderness and muscular tenderness present. No rigidity.  Cardiovascular: Normal rate and regular rhythm.   Pulmonary/Chest: Effort normal.  Musculoskeletal: Normal range of motion.  Lymphadenopathy:    He has no cervical adenopathy.  Neurological: He is alert and oriented to person, place, and time.  Skin: Skin is warm and dry. There is pallor.    ED Course  Procedures (including critical care time) Labs Review Labs Reviewed  CBC WITH DIFFERENTIAL - Abnormal; Notable for the following:    WBC 10.7 (*)    Eosinophils Relative 10 (*)    Eosinophils Absolute 1.0 (*)    All other components within normal limits  URINE RAPID DRUG SCREEN (HOSP PERFORMED) - Abnormal; Notable for the following:    Cocaine POSITIVE (*)    All other components within normal limits  POCT I-STAT, CHEM 8 -  Abnormal; Notable for the following:    Creatinine, Ser 1.70 (*)    Calcium, Ion 1.27 (*)    All other components within normal limits  SEDIMENTATION RATE   Imaging Review Ct Cervical Spine Wo Contrast  08/11/2013   CLINICAL DATA:  Neck pain, right shoulder pain. No injury.  EXAM: CT CERVICAL SPINE WITHOUT CONTRAST  TECHNIQUE: Multidetector CT imaging of the cervical spine was performed without intravenous contrast. Multiplanar CT image reconstructions were also generated.  COMPARISON:  06/03/2012   FINDINGS: Loss of normal cervical lordosis, similar to prior study. Progressive degenerative disc disease at C5-6 and C6-7. Prevertebral soft tissues are normal. No fracture. No epidural or paraspinal hematoma.  IMPRESSION: Progressive degenerative disc disease at C5-6 and C6-7. Cervical straightening. No acute bony abnormality.   Electronically Signed   By: Charlett Nose M.D.   On: 08/11/2013 02:20    MDM   1. Neck pain, bilateral   2. Muscle spasms of neck   3. Cocaine abuse     She was given IV fluids, a milligram of Dilaudid IV, and 5 mg of Valium IV, with significant decrease in his symptoms. When confronted about a positive UDS.  Patient denies any drug use   Arman Filter, NP 08/11/13 0414  Arman Filter, NP 08/11/13 [phone]

## 2018-03-19 ENCOUNTER — Emergency Department (HOSPITAL_COMMUNITY): Payer: Self-pay

## 2018-03-19 ENCOUNTER — Inpatient Hospital Stay (HOSPITAL_COMMUNITY)
Admission: EM | Admit: 2018-03-19 | Discharge: 2018-03-23 | DRG: 872 | Disposition: A | Discharge status: Home / Self Care | Payer: Self-pay | Attending: Internal Medicine | Admitting: Internal Medicine

## 2018-03-19 ENCOUNTER — Other Ambulatory Visit: Payer: Self-pay

## 2018-03-19 ENCOUNTER — Encounter (HOSPITAL_COMMUNITY): Payer: Self-pay | Admitting: Emergency Medicine

## 2018-03-19 DIAGNOSIS — A419 Sepsis, unspecified organism: Principal | ICD-10-CM | POA: Diagnosis present

## 2018-03-19 DIAGNOSIS — R7989 Other specified abnormal findings of blood chemistry: Secondary | ICD-10-CM | POA: Diagnosis present

## 2018-03-19 DIAGNOSIS — K8309 Other cholangitis: Secondary | ICD-10-CM

## 2018-03-19 DIAGNOSIS — K8010 Calculus of gallbladder with chronic cholecystitis without obstruction: Secondary | ICD-10-CM | POA: Diagnosis present

## 2018-03-19 DIAGNOSIS — R768 Other specified abnormal immunological findings in serum: Secondary | ICD-10-CM

## 2018-03-19 DIAGNOSIS — G8929 Other chronic pain: Secondary | ICD-10-CM | POA: Diagnosis present

## 2018-03-19 DIAGNOSIS — R748 Abnormal levels of other serum enzymes: Secondary | ICD-10-CM

## 2018-03-19 DIAGNOSIS — R1115 Cyclical vomiting syndrome unrelated to migraine: Secondary | ICD-10-CM

## 2018-03-19 DIAGNOSIS — N179 Acute kidney failure, unspecified: Secondary | ICD-10-CM

## 2018-03-19 DIAGNOSIS — R945 Abnormal results of liver function studies: Secondary | ICD-10-CM

## 2018-03-19 DIAGNOSIS — E860 Dehydration: Secondary | ICD-10-CM | POA: Diagnosis present

## 2018-03-19 DIAGNOSIS — F1410 Cocaine abuse, uncomplicated: Secondary | ICD-10-CM | POA: Diagnosis present

## 2018-03-19 DIAGNOSIS — R112 Nausea with vomiting, unspecified: Secondary | ICD-10-CM

## 2018-03-19 DIAGNOSIS — D696 Thrombocytopenia, unspecified: Secondary | ICD-10-CM | POA: Diagnosis present

## 2018-03-19 DIAGNOSIS — F17210 Nicotine dependence, cigarettes, uncomplicated: Secondary | ICD-10-CM | POA: Diagnosis present

## 2018-03-19 DIAGNOSIS — B1920 Unspecified viral hepatitis C without hepatic coma: Secondary | ICD-10-CM | POA: Diagnosis present

## 2018-03-19 LAB — COMPREHENSIVE METABOLIC PANEL
ALT: 5232 U/L — AB (ref 17–63)
AST: 2421 U/L — AB (ref 15–41)
Albumin: 3.5 g/dL (ref 3.5–5.0)
Alkaline Phosphatase: 190 U/L — ABNORMAL HIGH (ref 38–126)
Anion gap: 14 (ref 5–15)
BUN: 31 mg/dL — AB (ref 6–20)
CHLORIDE: 96 mmol/L — AB (ref 101–111)
CO2: 24 mmol/L (ref 22–32)
Calcium: 9.1 mg/dL (ref 8.9–10.3)
Creatinine, Ser: 1.79 mg/dL — ABNORMAL HIGH (ref 0.61–1.24)
GFR calc non Af Amer: 48 mL/min — ABNORMAL LOW (ref >60)
GFR, EST AFRICAN AMERICAN: 56 mL/min — AB (ref >60)
Glucose, Bld: 157 mg/dL — ABNORMAL HIGH (ref 65–99)
Potassium: 3.6 mmol/L (ref 3.5–5.1)
Sodium: 134 mmol/L — ABNORMAL LOW (ref 135–145)
Total Bilirubin: 11.3 mg/dL — ABNORMAL HIGH (ref 0.3–1.2)
Total Protein: 6.8 g/dL (ref 6.5–8.1)

## 2018-03-19 LAB — CBC
HCT: 50 % (ref 39.0–52.0)
Hemoglobin: 18.1 g/dL — ABNORMAL HIGH (ref 13.0–17.0)
MCH: 30.5 pg (ref 26.0–34.0)
MCHC: 36.2 g/dL — ABNORMAL HIGH (ref 30.0–36.0)
MCV: 84.2 fL (ref 78.0–100.0)
Platelets: 120 10*3/uL — ABNORMAL LOW (ref 150–400)
RBC: 5.94 MIL/uL — ABNORMAL HIGH (ref 4.22–5.81)
RDW: 13.5 % (ref 11.5–15.5)
WBC: 11.9 10*3/uL — ABNORMAL HIGH (ref 4.0–10.5)

## 2018-03-19 LAB — URINALYSIS, ROUTINE W REFLEX MICROSCOPIC
Bacteria, UA: NONE SEEN
Glucose, UA: NEGATIVE mg/dL
Ketones, ur: NEGATIVE mg/dL
Leukocytes, UA: NEGATIVE
Nitrite: NEGATIVE
PH: 5 (ref 5.0–8.0)
Protein, ur: 30 mg/dL — AB
SPECIFIC GRAVITY, URINE: 1.02 (ref 1.005–1.030)

## 2018-03-19 LAB — CK: CK TOTAL: 30 U/L — AB (ref 49–397)

## 2018-03-19 LAB — ACETAMINOPHEN LEVEL

## 2018-03-19 LAB — ETHANOL

## 2018-03-19 LAB — RAPID URINE DRUG SCREEN, HOSP PERFORMED
Amphetamines: POSITIVE — AB
Barbiturates: NOT DETECTED
Benzodiazepines: NOT DETECTED
Cocaine: POSITIVE — AB
OPIATES: NOT DETECTED
Tetrahydrocannabinol: NOT DETECTED

## 2018-03-19 LAB — PROTIME-INR
INR: 1.25
Prothrombin Time: 15.5 seconds — ABNORMAL HIGH (ref 11.4–15.2)

## 2018-03-19 LAB — I-STAT CG4 LACTIC ACID, ED
LACTIC ACID, VENOUS: 2.39 mmol/L — AB (ref 0.5–1.9)
Lactic Acid, Venous: 3.69 mmol/L (ref 0.5–1.9)

## 2018-03-19 LAB — LIPASE, BLOOD: LIPASE: 38 U/L (ref 11–51)

## 2018-03-19 LAB — AMMONIA: AMMONIA: 28 umol/L (ref 9–35)

## 2018-03-19 LAB — MONONUCLEOSIS SCREEN: Mono Screen: NEGATIVE

## 2018-03-19 LAB — BILIRUBIN, DIRECT: BILIRUBIN DIRECT: 6.8 mg/dL — AB (ref 0.1–0.5)

## 2018-03-19 MED ORDER — IOPAMIDOL (ISOVUE-300) INJECTION 61%
INTRAVENOUS | Status: AC
  Filled 2018-03-19: qty 100

## 2018-03-19 MED ORDER — SODIUM CHLORIDE 0.9 % IV BOLUS
1000.0000 mL | Freq: Once | INTRAVENOUS | Status: AC
  Administered 2018-03-19: 1000 mL via INTRAVENOUS

## 2018-03-19 MED ORDER — SODIUM CHLORIDE 0.9 % IV SOLN
Freq: Once | INTRAVENOUS | Status: AC
  Administered 2018-03-19: 17:00:00 via INTRAVENOUS

## 2018-03-19 MED ORDER — PIPERACILLIN-TAZOBACTAM 3.375 G IVPB 30 MIN
3.3750 g | Freq: Once | INTRAVENOUS | Status: AC
  Administered 2018-03-19: 3.375 g via INTRAVENOUS
  Filled 2018-03-19: qty 50

## 2018-03-19 MED ORDER — IOPAMIDOL (ISOVUE-300) INJECTION 61%
100.0000 mL | Freq: Once | INTRAVENOUS | Status: AC | PRN
  Administered 2018-03-19: 100 mL via INTRAVENOUS

## 2018-03-19 MED ORDER — MORPHINE SULFATE (PF) 4 MG/ML IV SOLN
4.0000 mg | Freq: Once | INTRAVENOUS | Status: AC
  Administered 2018-03-19: 4 mg via INTRAVENOUS
  Filled 2018-03-19: qty 1

## 2018-03-19 MED ORDER — ONDANSETRON HCL 4 MG/2ML IJ SOLN
4.0000 mg | Freq: Once | INTRAMUSCULAR | Status: AC
  Administered 2018-03-19: 4 mg via INTRAVENOUS
  Filled 2018-03-19: qty 2

## 2018-03-19 NOTE — ED Provider Notes (Signed)
[facility] DEPT Provider Note   CSN: [id number] Arrival date & time: 03/19/18  1557     History   Chief Complaint Chief Complaint  Patient presents with  . Abdominal Pain  . Emesis    HPI Jeffrey Wise is a 32 y.o. male presenting for evaluation of nausea and vomiting.   Patient states for the past week, he has had persistent nausea and vomiting.  He is been unable to tolerate any p.o. intake including water.  He reports that began with associated epigastric burning, which felt like heartburn.  He currently reports lower abdominal pain which is constant and mild.  He denies fevers, chills, chest pain, shortness of breath, abnormal urination, abnormal bowel movements.  He denies history of similar.  No history of abdominal problems or previous abdominal surgeries.  He denies recent travel.  No sick contacts.  He denies smoking, alcohol use, or drug use.  He has no other medical problems, does not take medications daily.  He has not taken anything for symptoms.  HPI  History reviewed. No pertinent past medical history.  Patient Active Problem List   Diagnosis Date Noted  . Back pain, chronic 07/25/2011    Past Surgical History:  Procedure Laterality Date  . NO PAST SURGERIES          Home Medications    Prior to Admission medications   Medication Sig Start Date End Date Taking? Authorizing Provider  ibuprofen (ADVIL,MOTRIN) 600 MG tablet Take 1 tablet (600 mg total) by mouth every 6 (six) hours as needed for pain. Patient not taking: Reported on 03/19/2018 08/11/13   Junius Creamer, NP  methocarbamol (ROBAXIN) 500 MG tablet Take 1 tablet (500 mg total) by mouth 2 (two) times daily. Patient not taking: Reported on 03/19/2018 08/11/13   Junius Creamer, NP    Family History History reviewed. No pertinent family history.  Social History Social History   Tobacco Use  . Smoking status: Current Every Day Smoker    Packs/day: 0.50    Types:  Cigarettes  . Smokeless tobacco: Never Used  Substance Use Topics  . Alcohol use: Never    Frequency: Never  . Drug use: Never     Allergies   Patient has no known allergies.   Review of Systems Review of Systems  Gastrointestinal: Positive for abdominal pain, nausea and vomiting.  All other systems reviewed and are negative.    Physical Exam Updated Vital Signs BP 118/81   Pulse 93   Temp (!) 97.4 F (36.3 C) (Oral)   Resp (!) 23   Ht 5' 9"  (1.753 m)   Wt 63.5 kg (140 lb)   SpO2 98%   BMI 20.67 kg/m   Physical Exam  Constitutional: He is oriented to person, place, and time. He appears well-developed and well-nourished.  Patient appears uncomfortable.  HENT:  Head: Normocephalic and atraumatic.  Mouth/Throat: Mucous membranes are dry.  MM dry  Eyes: Pupils are equal, round, and reactive to light. Conjunctivae, EOM and lids are normal. Scleral icterus is present.  Scleral icterus  Neck: Normal range of motion. Neck supple.  Cardiovascular: Regular rhythm and intact distal pulses.  Mildly tachycardic ~100  Pulmonary/Chest: Effort normal and breath sounds normal. No respiratory distress. He has no wheezes.  Abdominal: Soft. Bowel sounds are normal. He exhibits no distension and no mass. There is tenderness. There is no guarding.  Mild TTP of lower abd. abd without rigidity, guarding, or distention.   Musculoskeletal: Normal  range of motion.  Neurological: He is alert and oriented to person, place, and time.  Slow to respond, but alert and oriented  Skin: Skin is warm and dry.  Psychiatric: He has a normal mood and affect.  Nursing note and vitals reviewed.    ED Treatments / Results  Labs (all labs ordered are listed, but only abnormal results are displayed) Labs Reviewed  COMPREHENSIVE METABOLIC PANEL - Abnormal; Notable for the following components:      Result Value   Sodium 134 (*)    Chloride 96 (*)    Glucose, Bld 157 (*)    BUN 31 (*)     Creatinine, Ser 1.79 (*)    AST 2,421 (*)    ALT 5,232 (*)    Alkaline Phosphatase 190 (*)    Total Bilirubin 11.3 (*)    GFR calc non Af Amer 48 (*)    GFR calc Af Amer 56 (*)    All other components within normal limits  CBC - Abnormal; Notable for the following components:   WBC 11.9 (*)    RBC 5.94 (*)    Hemoglobin 18.1 (*)    MCHC 36.2 (*)    Platelets 120 (*)    All other components within normal limits  URINALYSIS, ROUTINE W REFLEX MICROSCOPIC - Abnormal; Notable for the following components:   Color, Urine AMBER (*)    APPearance CLOUDY (*)    Hgb urine dipstick SMALL (*)    Bilirubin Urine MODERATE (*)    Protein, ur 30 (*)    All other components within normal limits  RAPID URINE DRUG SCREEN, HOSP PERFORMED - Abnormal; Notable for the following components:   Cocaine POSITIVE (*)    Amphetamines POSITIVE (*)    All other components within normal limits  BILIRUBIN, DIRECT - Abnormal; Notable for the following components:   Bilirubin, Direct 6.8 (*)    All other components within normal limits  PROTIME-INR - Abnormal; Notable for the following components:   Prothrombin Time 15.5 (*)    All other components within normal limits  ACETAMINOPHEN LEVEL - Abnormal; Notable for the following components:   Acetaminophen (Tylenol), Serum <10 (*)    All other components within normal limits  I-STAT CG4 LACTIC ACID, ED - Abnormal; Notable for the following components:   Lactic Acid, Venous 3.69 (*)    All other components within normal limits  I-STAT CG4 LACTIC ACID, ED - Abnormal; Notable for the following components:   Lactic Acid, Venous 2.39 (*)    All other components within normal limits  CULTURE, BLOOD (ROUTINE X 2)  CULTURE, BLOOD (ROUTINE X 2)  LIPASE, BLOOD  AMMONIA  ETHANOL  MONONUCLEOSIS SCREEN  HEPATITIS PANEL, ACUTE  ANTINUCLEAR ANTIBODIES, IFA  CERULOPLASMIN  IGG  ANTI-SMOOTH MUSCLE ANTIBODY, IGG  HERPES SIMPLEX VIRUS(HSV) DNA BY PCR  CK    EKG EKG  Interpretation  Date/Time:  Wednesday Mar 19 2018 17:49:39 EDT Ventricular Rate:  80 PR Interval:    QRS Duration: 101 QT Interval:  415 QTC Calculation: 479 R Axis:   73 Text Interpretation:  Sinus rhythm Borderline prolonged QT interval No prior ECG for comparison.  NO STEMI Confirmed by Antony Blackbird [phone]) on 03/19/2018 8:30:17 PM   Radiology Ct Abdomen Pelvis W Contrast  Result Date: 03/19/2018 CLINICAL DATA:  Abdominal pain with nausea and vomiting 7 days. EXAM: CT ABDOMEN AND PELVIS WITH CONTRAST TECHNIQUE: Multidetector CT imaging of the abdomen and pelvis was performed using the standard protocol  following bolus administration of intravenous contrast. CONTRAST:  190m ISOVUE-300 IOPAMIDOL (ISOVUE-300) INJECTION 61% COMPARISON:  12/21/2010 FINDINGS: Lower chest: Lung bases are normal. Couple adjacent right pericardiophrenic lymph nodes with the larger measuring 9 mm by short axis. Hepatobiliary: Liver and biliary tree are normal. Significant gallbladder wall thickening and possible small amount of pericholecystic fluid. No definite sludge or cholelithiasis. Pancreas: Normal. Spleen: Mild splenomegaly. Adrenals/Urinary Tract: Adrenal glands are normal. 2 mm stone over the upper pole collecting system of the left kidney. No right renal stones. No hydronephrosis. Ureters and bladder are normal. Stomach/Bowel: Stomach and small bowel are within normal. Appendix is normal. Colon is unremarkable. Vascular/Lymphatic: Abdominal aorta is normal. Remaining vascular structures are unremarkable. No adenopathy. Reproductive: Small amount of free fluid in the pelvis. Normal prostate. Other: No acute inflammatory change. Musculoskeletal: Unremarkable. IMPRESSION: Significant gallbladder wall thickening and possible small amount of pericholecystic fluid. No significant adjacent inflammatory change and no definite stones or sludge. Recommend right upper quadrant ultrasound for further evaluation. 2 mm  nonobstructing left renal stone. Small amount of nonspecific free fluid in the pelvis. Electronically Signed   By: DMarin OlpM.D.   On: 03/19/2018 19:15    Procedures .Critical Care Performed by: CFranchot Heidelberg PA-C Authorized by: CFranchot Heidelberg PA-C   Critical care provider statement:    Critical care time (minutes):  50   Critical care time was exclusive of:  Separately billable procedures and treating other patients and teaching time   Critical care was necessary to treat or prevent imminent or life-threatening deterioration of the following conditions:  Hepatic failure and sepsis   Critical care was time spent personally by me on the following activities:  Blood draw for specimens, development of treatment plan with patient or surrogate, discussions with consultants, evaluation of patient's response to treatment, examination of patient, obtaining history from patient or surrogate, ordering and performing treatments and interventions, ordering and review of laboratory studies, ordering and review of radiographic studies, pulse oximetry, re-evaluation of patient's condition and review of old charts   I assumed direction of critical care for this patient from another provider in my specialty: no   Comments:     Patiently critically ill.  Code sepsis called.  Liver enzymes critically elevated. Abx and fluids started immediately. Frequent recheckes of pt's condition.    (including critical care time)  Medications Ordered in ED Medications  iopamidol (ISOVUE-300) 61 % injection (has no administration in time range)  sodium chloride 0.9 % bolus 1,000 mL (1,000 mLs Intravenous New Bag/Given 03/19/18 2322)  0.9 %  sodium chloride infusion ( Intravenous Stopped 03/19/18 1813)  piperacillin-tazobactam (ZOSYN) IVPB 3.375 g (0 g Intravenous Stopped 03/19/18 1803)  ondansetron (ZOFRAN) injection 4 mg (4 mg Intravenous Given 03/19/18 1733)  iopamidol (ISOVUE-300) 61 % injection 100 mL (100 mLs  Intravenous Contrast Given 03/19/18 1827)  morphine 4 MG/ML injection 4 mg (4 mg Intravenous Given 03/19/18 2125)     Initial Impression / Assessment and Plan / ED Course  I have reviewed the triage vital signs and the nursing notes.  Pertinent labs & imaging results that were available during my care of the patient were reviewed by me and considered in my medical decision making (see chart for details).     Patient presenting for evaluation of nausea and vomiting for the past week.  Physical exam concerning, patient is tachycardic and appears jaundiced.  He is slow to respond, but alert and oriented.  Will obtain labs, lactic, and urine.  Labs concerning, patient with leukocytosis of 11.9.  Lactate elevated at 3.69.  Will call code sepsis, patient started on IV fluids and Zosyn.  Zofran for nausea.  Will obtain CT abdomen for further evaluation.   CMP concerning, patient with significantly elevated AST, ALT, and alk phos.  Bili elevated 11.3.  Will obtain direct bili.  AKI with SCr at 1.79.  UA without infection.  On reassessment, patient reports he is in a lot of pain.  He has not thrown up since arrival to the ER.  Mental status unchanged.  CT abdomen shows significant gallbladder wall thickening and possible small amount of pericholecystic fluid without sludge or cholelithiasis.  Recommends right upper quadrant ultrasound.  I am concerned about cholangitis, will consult with GI. Case discussed with attending, Dr. Sherry Ruffing evaluated the pt.   Sepsis reassessment done.  Lactate improved.  Heart rate has improved.  Discussed with Dr. Havery Moros from GI, who recommended adding a PT/INR, Tylenol level, CK and other labs.  If INR is abnormal, consider transfer to liver transplant center.  If patient's mental status changes, consider prompt intubation.  INR and Tylenol reassuring.  Discussed with Dr. Havery Moros from GI, who recommends admission to stepdown unit with frequent neuro checks.  Recheck  liver enzymes and INR in the morning.  If neuro status changes, consider intubation.  Recommends admission to hospitalist service, and he will consult while in the hospital.  Discussed plan with patient, patient is agreeable to plan.  Right upper quadrant ultrasound pending.  On reassessment, patient is alert and oriented, mental status is improved.  Vitals remained stable.  Discussed with hospitalist, patient to be admitted to their service.  Discussed that Gi recommends admission to SDU with frequent neuro rechecks and low threshold for intubation.    Final Clinical Impressions(s) / ED Diagnoses   Final diagnoses:  Nausea & vomiting  Cholangitis  AKI (acute kidney injury) [facility])    ED Discharge Orders    None       Franchot Heidelberg, PA-C 03/19/18 2324    Tegeler, Gwenyth Allegra, MD 03/19/18 2351

## 2018-03-19 NOTE — ED Triage Notes (Signed)
Pt complaint of abdominal with n/v for 7 days.

## 2018-03-19 NOTE — ED Notes (Signed)
I gave critical I Stat CG4 results to MD Darl Householder

## 2018-03-19 NOTE — H&P (Signed)
History and Physical    Jeffrey Wise CBI:[id number] DOB: 02/09/1986 DOA: 03/19/2018  PCP: Patient, No Pcp Per   Patient coming from: home    Chief Complaint: N/V, abdominal pain  HPI: Jeffrey Wise is a 32 y.o. male with no significant medical history who comes in with 7 days of nausea and vomiting, diminished p.o. intake and found to have profoundly elevated mixed hepatocellular and cholestatic pattern of liver injury and AKI.  Patient reports he was doing well until approximately 7 days ago when he began to have episodes of nonbilious nonbloody emesis after taking any p.o. intake.  He also began to develop severe sharp left lower quadrant abdominal pain.  Pain was exacerbated by eating and relieved by emesis.  Patient did not have any radiation of the pain.  Pain would wax and wane was not associated with any position.  Patient did not have any diarrhea.  Has not had any travel outside the country.  He denies any fevers, chest pain, diarrhea, lower extremity edema, orthopnea, paroxysmal nocturnal dyspnea, rash, recreational drug use.  ED Course: In the ED patient's vitals were notable for mild tachycardia.  Labs were notable for a white blood cell count of 11.9, platelets of 120, creatinine 1.79, total bilirubin 11.3, alkaline phosphatase 190, AST of 2421, ALT of 5232.  CT of the abdomen pelvis showed thickened gallbladder with recommendation for ultrasound.  Right upper quadrant ultrasound with Dopplers showed cholelithiasis with gallbladder wall thickening that was felt to be chronic as patient did not have any sonographic Murphy sign.  Hepatic blood flow appeared to be normal.  Gastroenterology was consulted and recommended additional labs detailed below.  Patient was noted to have elevated lactate x2 but elevated white count met criteria for sepsis.  Patient received sepsis bolus and was started on IV Zosyn.  Review of Systems: As per HPI otherwise 10 point review of systems negative.     History reviewed. No pertinent past medical history.  Past Surgical History:  Procedure Laterality Date  . NO PAST SURGERIES       reports that he has been smoking cigarettes.  He has been smoking about 0.50 packs per day. He has never used smokeless tobacco. He reports that he does not drink alcohol or use drugs.  No Known Allergies  Family History  Problem Relation Age of Onset  . Lung cancer Father    Unacceptable: Noncontributory, unremarkable, or negative. Acceptable: Family history reviewed and not pertinent (If you reviewed it)  Prior to Admission medications   Medication Sig Start Date End Date Taking? Authorizing Provider  ibuprofen (ADVIL,MOTRIN) 600 MG tablet Take 1 tablet (600 mg total) by mouth every 6 (six) hours as needed for pain. Patient not taking: Reported on 03/19/2018 08/11/13   Junius Creamer, NP  methocarbamol (ROBAXIN) 500 MG tablet Take 1 tablet (500 mg total) by mouth 2 (two) times daily. Patient not taking: Reported on 03/19/2018 08/11/13   Junius Creamer, NP    Physical Exam: Vitals:   03/19/18 1740 03/19/18 1800 03/19/18 2100 03/19/18 2313  BP:  119/70 123/83 118/81  Pulse:  73 91 93  Resp:  16 (!) 22 (!) 23  Temp:      TempSrc:      SpO2:  100% 98% 98%  Weight: 63.5 kg (140 lb)     Height: 5' 9"  (1.753 m)       Constitutional: NAD, calm, comfortable Vitals:   03/19/18 1740 03/19/18 1800 03/19/18 2100 03/19/18 2313  BP:  119/70 123/83 118/81  Pulse:  73 91 93  Resp:  16 (!) 22 (!) 23  Temp:      TempSrc:      SpO2:  100% 98% 98%  Weight: 63.5 kg (140 lb)     Height: 5' 9"  (1.753 m)      Eyes: icteric sclera ENMT: Dry mucous membranes, good dentition Neck: normal, supple Respiratory: clear to auscultation bilaterally, no wheezing, no crackles. Normal respiratory effort. No accessory muscle use.  Cardiovascular: Regular rate and rhythm, no murmurs Abdomen: Soft, diminished bowel sounds, minimal right upper quadrant tenderness, significant  suprapubic and left lower quadrant tenderness, no rebound or guarding.  Musculoskeletal: No lower extremity edema Skin: no rashes on visible skin Neurologic: CN 2-12 grossly intact.  Moving all extremities Psychiatric: Normal judgment and insight. Alert and oriented x 3. Normal mood.    Labs on Admission: I have personally reviewed following labs and imaging studies  CBC: Recent Labs  Lab 03/19/18 1636  WBC 11.9*  HGB 18.1*  HCT 50.0  MCV 84.2  PLT 564*   Basic Metabolic Panel: Recent Labs  Lab 03/19/18 1636  NA 134*  K 3.6  CL 96*  CO2 24  GLUCOSE 157*  BUN 31*  CREATININE 1.79*  CALCIUM 9.1   GFR: Estimated Creatinine Clearance: 52.7 mL/min (A) (by C-G formula based on SCr of 1.79 mg/dL (H)). Liver Function Tests: Recent Labs  Lab 03/19/18 1636  AST 2,421*  ALT 5,232*  ALKPHOS 190*  BILITOT 11.3*  PROT 6.8  ALBUMIN 3.5   Recent Labs  Lab 03/19/18 1636  LIPASE 38   Recent Labs  Lab 03/19/18 1659  AMMONIA 28   Coagulation Profile: Recent Labs  Lab 03/19/18 2131  INR 1.25   Cardiac Enzymes: Recent Labs  Lab 03/19/18 2131  CKTOTAL 30*   BNP (last 3 results) No results for input(s): PROBNP in the last 8760 hours. HbA1C: No results for input(s): HGBA1C in the last 72 hours. CBG: No results for input(s): GLUCAP in the last 168 hours. Lipid Profile: No results for input(s): CHOL, HDL, LDLCALC, TRIG, CHOLHDL, LDLDIRECT in the last 72 hours. Thyroid Function Tests: No results for input(s): TSH, T4TOTAL, FREET4, T3FREE, THYROIDAB in the last 72 hours. Anemia Panel: No results for input(s): VITAMINB12, FOLATE, FERRITIN, TIBC, IRON, RETICCTPCT in the last 72 hours. Urine analysis:    Component Value Date/Time   COLORURINE AMBER (A) 03/19/2018 1707   APPEARANCEUR CLOUDY (A) 03/19/2018 1707   LABSPEC 1.020 03/19/2018 1707   PHURINE 5.0 03/19/2018 1707   GLUCOSEU NEGATIVE 03/19/2018 1707   HGBUR SMALL (A) 03/19/2018 1707   BILIRUBINUR MODERATE  (A) 03/19/2018 1707   KETONESUR NEGATIVE 03/19/2018 1707   PROTEINUR 30 (A) 03/19/2018 1707   UROBILINOGEN 1.0 05/30/2011 0957   NITRITE NEGATIVE 03/19/2018 1707   LEUKOCYTESUR NEGATIVE 03/19/2018 1707    Radiological Exams on Admission: Ct Abdomen Pelvis W Contrast  Result Date: 03/19/2018 CLINICAL DATA:  Abdominal pain with nausea and vomiting 7 days. EXAM: CT ABDOMEN AND PELVIS WITH CONTRAST TECHNIQUE: Multidetector CT imaging of the abdomen and pelvis was performed using the standard protocol following bolus administration of intravenous contrast. CONTRAST:  134m ISOVUE-300 IOPAMIDOL (ISOVUE-300) INJECTION 61% COMPARISON:  12/21/2010 FINDINGS: Lower chest: Lung bases are normal. Couple adjacent right pericardiophrenic lymph nodes with the larger measuring 9 mm by short axis. Hepatobiliary: Liver and biliary tree are normal. Significant gallbladder wall thickening and possible small amount of pericholecystic fluid. No definite sludge or  cholelithiasis. Pancreas: Normal. Spleen: Mild splenomegaly. Adrenals/Urinary Tract: Adrenal glands are normal. 2 mm stone over the upper pole collecting system of the left kidney. No right renal stones. No hydronephrosis. Ureters and bladder are normal. Stomach/Bowel: Stomach and small bowel are within normal. Appendix is normal. Colon is unremarkable. Vascular/Lymphatic: Abdominal aorta is normal. Remaining vascular structures are unremarkable. No adenopathy. Reproductive: Small amount of free fluid in the pelvis. Normal prostate. Other: No acute inflammatory change. Musculoskeletal: Unremarkable. IMPRESSION: Significant gallbladder wall thickening and possible small amount of pericholecystic fluid. No significant adjacent inflammatory change and no definite stones or sludge. Recommend right upper quadrant ultrasound for further evaluation. 2 mm nonobstructing left renal stone. Small amount of nonspecific free fluid in the pelvis. Electronically Signed   By: Marin Olp M.D.   On: 03/19/2018 19:15    EKG: Independently reviewed.  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  His imaging does not show a clear etiology for this profoundly significant pattern of liver injury.  The differential includes viral hepatitides, autoimmune hepatitis, Wilson's disease, acetaminophen or other chronic poisoning.  His symptoms are not consistent with cholangitis as he does not appear to be significantly septic and his lactate is likely the setting of significant delay. - Pending ceruloplasmin, ANA, acute hepatitis panel, herpes simplex virus PCR, anti-smooth muscle antibody, anti-microsomal/kidney antibody, IgG levels, CMV PCR - Mononucleosis screen negative, consider EBV titers suspicion is high - Consider MRCP -Follow-up blood cultures x2 ordered 03/19/2017 -Continue IV Zosyn, will consider de-escalation pending patient's clinical course -Admit to stepdown with telemetry -N.p.o. for consideration of possible liver biopsy  #) AKI: Likely in setting of [place] p.o. intake -IV fluids  #) Thrombocytopenia: Unclear if this is related to his underlying process with the liver.  He does have mild splenomegaly noted on CT. -DIC panel ordered  Fluids: IV fluids None elect lites: Supplement Nutrition: N.p.o. with sips  Prophylaxis: Subcu heparin  Disposition: Pending improvement of liver function test  Full code    Cristy Folks MD [facility]   If 7PM-7AM, please contact night-coverage [email or web address] Password [facility]  03/19/2018, 11:33 PM

## 2018-03-19 NOTE — Progress Notes (Signed)
A consult was received from an ED provider for Zosyn per pharmacy dosing.  The patient's profile has been reviewed for ht/wt/allergies/indication/available labs.    A one time order has already been placed by provider for Zosyn 3.375g IV (30 minute infusion).  Further antibiotics/pharmacy consults should be ordered by admitting physician if indicated.                       Thank you, Luiz Ochoa 03/19/2018  5:08 PM

## 2018-03-20 DIAGNOSIS — N179 Acute kidney failure, unspecified: Secondary | ICD-10-CM

## 2018-03-20 DIAGNOSIS — D696 Thrombocytopenia, unspecified: Secondary | ICD-10-CM

## 2018-03-20 DIAGNOSIS — F1910 Other psychoactive substance abuse, uncomplicated: Secondary | ICD-10-CM

## 2018-03-20 DIAGNOSIS — D72829 Elevated white blood cell count, unspecified: Secondary | ICD-10-CM

## 2018-03-20 DIAGNOSIS — G43A1 Cyclical vomiting, intractable: Secondary | ICD-10-CM

## 2018-03-20 DIAGNOSIS — R748 Abnormal levels of other serum enzymes: Secondary | ICD-10-CM

## 2018-03-20 LAB — HEPATIC FUNCTION PANEL
ALT: 3914 U/L — ABNORMAL HIGH (ref 17–63)
AST: 1431 U/L — ABNORMAL HIGH (ref 15–41)
Albumin: 3.1 g/dL — ABNORMAL LOW (ref 3.5–5.0)
Alkaline Phosphatase: 164 U/L — ABNORMAL HIGH (ref 38–126)
Bilirubin, Direct: 6.5 mg/dL — ABNORMAL HIGH (ref 0.1–0.5)
Indirect Bilirubin: 4.1 mg/dL — ABNORMAL HIGH (ref 0.3–0.9)
Total Bilirubin: 10.6 mg/dL — ABNORMAL HIGH (ref 0.3–1.2)
Total Protein: 6.2 g/dL — ABNORMAL LOW (ref 6.5–8.1)

## 2018-03-20 LAB — BASIC METABOLIC PANEL
Anion gap: 13 (ref 5–15)
CO2: 20 mmol/L — ABNORMAL LOW (ref 22–32)
Calcium: 8.5 mg/dL — ABNORMAL LOW (ref 8.9–10.3)
GFR calc Af Amer: >60 mL/min (ref >60)
Glucose, Bld: 99 mg/dL (ref 65–99)
Potassium: 3.5 mmol/L (ref 3.5–5.1)
Sodium: 133 mmol/L — ABNORMAL LOW (ref 135–145)

## 2018-03-20 LAB — HIV ANTIBODY (ROUTINE TESTING W REFLEX): HIV Screen 4th Generation wRfx: NONREACTIVE

## 2018-03-20 LAB — DIC (DISSEMINATED INTRAVASCULAR COAGULATION)PANEL
D-Dimer, Quant: 1.54 ug{FEU}/mL — ABNORMAL HIGH (ref 0.00–0.50)
Fibrinogen: 171 mg/dL — ABNORMAL LOW (ref 210–475)
INR: 1.25
Platelets: 113 10*3/uL — ABNORMAL LOW (ref 150–400)
Prothrombin Time: 15.6 s — ABNORMAL HIGH (ref 11.4–15.2)
Smear Review: NONE SEEN
aPTT: 41 seconds — ABNORMAL HIGH (ref 24–36)

## 2018-03-20 LAB — CBC
HCT: 46.5 % (ref 39.0–52.0)
Hemoglobin: 16.7 g/dL (ref 13.0–17.0)
MCH: 29.5 pg (ref 26.0–34.0)
MCHC: 35.9 g/dL (ref 30.0–36.0)
MCV: 82 fL (ref 78.0–100.0)
Platelets: 113 K/uL — ABNORMAL LOW (ref 150–400)
RBC: 5.67 MIL/uL (ref 4.22–5.81)
RDW: 13.6 % (ref 11.5–15.5)
WBC: 15.8 K/uL — ABNORMAL HIGH (ref 4.0–10.5)

## 2018-03-20 LAB — BASIC METABOLIC PANEL WITH GFR
BUN: 28 mg/dL — ABNORMAL HIGH (ref 6–20)
Chloride: 100 mmol/L — ABNORMAL LOW (ref 101–111)
Creatinine, Ser: 1.36 mg/dL — ABNORMAL HIGH (ref 0.61–1.24)
GFR calc non Af Amer: >60 mL/min (ref >60)

## 2018-03-20 LAB — LACTATE DEHYDROGENASE: LDH: 234 U/L — ABNORMAL HIGH (ref 98–192)

## 2018-03-20 MED ORDER — ONDANSETRON HCL 4 MG PO TABS: 4.0000 mg | ORAL_TABLET | Freq: Four times a day (QID) | ORAL | Status: DC | PRN

## 2018-03-20 MED ORDER — SODIUM CHLORIDE 0.9 % IV SOLN
INTRAVENOUS | Status: DC
  Administered 2018-03-20 – 2018-03-21 (×4): via INTRAVENOUS

## 2018-03-20 MED ORDER — ONDANSETRON HCL 4 MG/2ML IJ SOLN
4.0000 mg | Freq: Four times a day (QID) | INTRAMUSCULAR | Status: DC | PRN
  Administered 2018-03-20: 4 mg via INTRAVENOUS
  Filled 2018-03-20: qty 2

## 2018-03-20 MED ORDER — HEPARIN SODIUM (PORCINE) 5000 UNIT/ML IJ SOLN
5000.0000 [IU] | Freq: Three times a day (TID) | INTRAMUSCULAR | Status: DC
  Administered 2018-03-20 – 2018-03-22 (×5): 5000 [IU] via SUBCUTANEOUS
  Filled 2018-03-20 (×5): qty 1

## 2018-03-20 MED ORDER — PIPERACILLIN-TAZOBACTAM 3.375 G IVPB
3.3750 g | Freq: Three times a day (TID) | INTRAVENOUS | Status: DC
  Administered 2018-03-20 – 2018-03-23 (×11): 3.375 g via INTRAVENOUS
  Filled 2018-03-20 (×12): qty 50

## 2018-03-20 MED ORDER — OXYCODONE HCL 5 MG PO TABS
5.0000 mg | ORAL_TABLET | ORAL | Status: DC | PRN
  Administered 2018-03-20 (×2): 5 mg via ORAL
  Filled 2018-03-20 (×2): qty 1

## 2018-03-20 MED ORDER — POLYETHYLENE GLYCOL 3350 17 G PO PACK: 17.0000 g | PACK | Freq: Every day | ORAL | Status: DC | PRN

## 2018-03-20 NOTE — Progress Notes (Signed)
[name], male   DOB: 1986-09-14, 32 y.o.   MRN: [id number]  PROGRESS NOTE    Jeffrey Wise  TGG:[id number] DOB: [birth year] DOA: 03/19/2018 PCP: Patient, No Pcp Per   Brief Narrative:  32 year old male with no past medical history presented with nausea, vomiting and abdominal pain.  He was found to have elevated LFTs and CT of the abdomen pelvis and ultrasound showed cholelithiasis with gallbladder thickening.  ED provider consulted GI.  Patient was started empirically on IV Zosyn.  Assessment & Plan:   Active Problems:   AKI (acute kidney injury) ([place])   Elevated liver enzymes   Elevated LFTs    Mixed hepatocellular and cholestatic pattern of liver injury:  -No evidence of acute liver failure and encephalopathy -GI evaluation pending  -No signs of cholangitis.   -Empirically on Zosyn for concern for cholecystitis  - Pending ceruloplasmin, ANA, acute hepatitis panel, herpes simplex virus PCR, anti-smooth muscle antibody, anti-microsomal/kidney antibody, IgG levels, CMV PCR -Currently n.p.o.  Cholelithiasis with probable cholecystitis -Continue Zosyn.  Follow cultures.  General surgery consulted  Leukocytosis -Probably secondary to above.  Continue antibiotics.  Repeat a.m. Labs  Polysubstance abuse -Urine drug screen positive for cocaine and amphetamines.  Patient denies any active use.  Counseled about this side effects of the same and encouraged cessation.  AKI:  -Probably secondary to poor oral intake and dehydration.  Improving.  Continue IV fluids.  Repeat a.m. Labs  Thrombocytopenia:  -Unclear etiology.  No signs of bleeding.  Repeat a.m. labs   DVT prophylaxis: Heparin  code Status: Full Family Communication: None at bedside Disposition Plan: Depends on clinical outcome  Consultants: GI/general surgery  Procedures: None  Antimicrobials: Zosyn from 03/19/2018 onwards   Subjective: Patient seen and examined at bedside.  He complains of mild  nausea and some abdominal pain.  No current vomiting, chest pain, shortness of breath.  Objective: Vitals:   03/20/18 0018 03/20/18 0100 03/20/18 0328 03/20/18 0400  BP: 119/80 138/81  124/71  Pulse: 78 88    Resp: 18 20  16   Temp:   98.8 F (37.1 C)   TempSrc:   Oral   SpO2: 100% 100%  94%  Weight:  81.9 kg (180 lb 8.9 oz)    Height:  5\' 9"  (1.753 m)      Intake/Output Summary (Last 24 hours) at 03/20/2018 0804 Last data filed at 03/20/2018 0400 Gross per 24 hour  Intake 2443.75 ml  Output -  Net 2443.75 ml   Filed Weights   03/19/18 1740 03/20/18 0100  Weight: 63.5 kg (140 lb) 81.9 kg (180 lb 8.9 oz)    Examination:  General exam: Appears calm and comfortable. Eyes: Icterus present, no pallor Respiratory system: Bilateral decreased breath sound at bases Cardiovascular system: S1 & S2 heard, rate controlled  gastrointestinal system: Abdomen is nondistended, soft and mildly tender in the right upper quadrant, no Murphy sign present. Normal bowel sounds heard. Central nervous system: Alert and oriented. No focal neurological deficits. Moving extremities Extremities: No cyanosis, clubbing, edema  Skin: No rashes, lesions or ulcers Psychiatry: Flat affect    Data Reviewed: I have personally reviewed following labs and imaging studies  CBC: Recent Labs  Lab 03/19/18 1636 03/20/18 0142  WBC 11.9* 15.8*  HGB 18.1* 16.7  HCT 50.0 46.5  MCV 84.2 82.0  PLT 120* 113*  703*   Basic Metabolic Panel: Recent Labs  Lab 03/19/18 1636 03/20/18 0142  NA 134* 133*  K 3.6  3.5  CL 96* 100*  CO2 24 20*  GLUCOSE 157* 99  BUN 31* 28*  CREATININE 1.79* 1.36*  CALCIUM 9.1 8.5*   GFR: Estimated Creatinine Clearance: 77.3 mL/min (A) (by C-G formula based on SCr of 1.36 mg/dL (H)). Liver Function Tests: Recent Labs  Lab 03/19/18 1636 03/20/18 0142  AST 2,421* 1,431*  ALT 5,232* 3,914*  ALKPHOS 190* 164*  BILITOT 11.3* 10.6*  PROT 6.8 6.2*  ALBUMIN 3.5 3.1*   Recent  Labs  Lab 03/19/18 1636  LIPASE 38   Recent Labs  Lab 03/19/18 1659  AMMONIA 28   Coagulation Profile: Recent Labs  Lab 03/19/18 2131 03/20/18 0142  INR 1.25 1.25   Cardiac Enzymes: Recent Labs  Lab 03/19/18 2131  CKTOTAL 30*   BNP (last 3 results) No results for input(s): PROBNP in the last 8760 hours. HbA1C: No results for input(s): HGBA1C in the last 72 hours. CBG: No results for input(s): GLUCAP in the last 168 hours. Lipid Profile: No results for input(s): CHOL, HDL, LDLCALC, TRIG, CHOLHDL, LDLDIRECT in the last 72 hours. Thyroid Function Tests: No results for input(s): TSH, T4TOTAL, FREET4, T3FREE, THYROIDAB in the last 72 hours. Anemia Panel: No results for input(s): VITAMINB12, FOLATE, FERRITIN, TIBC, IRON, RETICCTPCT in the last 72 hours. Sepsis Labs: Recent Labs  Lab 03/19/18 1643 03/19/18 1913  LATICACIDVEN 3.69* 2.39*    No results found for this or any previous visit (from the past 240 hour(s)).       Radiology Studies: Ct Abdomen Pelvis W Contrast  Result Date: 03/19/2018 CLINICAL DATA:  Abdominal pain with nausea and vomiting 7 days. EXAM: CT ABDOMEN AND PELVIS WITH CONTRAST TECHNIQUE: Multidetector CT imaging of the abdomen and pelvis was performed using the standard protocol following bolus administration of intravenous contrast. CONTRAST:  1105mL ISOVUE-300 IOPAMIDOL (ISOVUE-300) INJECTION 61% COMPARISON:  12/21/2010 FINDINGS: Lower chest: Lung bases are normal. Couple adjacent right pericardiophrenic lymph nodes with the larger measuring 9 mm by short axis. Hepatobiliary: Liver and biliary tree are normal. Significant gallbladder wall thickening and possible small amount of pericholecystic fluid. No definite sludge or cholelithiasis. Pancreas: Normal. Spleen: Mild splenomegaly. Adrenals/Urinary Tract: Adrenal glands are normal. 2 mm stone over the upper pole collecting system of the left kidney. No right renal stones. No hydronephrosis. Ureters and  bladder are normal. Stomach/Bowel: Stomach and small bowel are within normal. Appendix is normal. Colon is unremarkable. Vascular/Lymphatic: Abdominal aorta is normal. Remaining vascular structures are unremarkable. No adenopathy. Reproductive: Small amount of free fluid in the pelvis. Normal prostate. Other: No acute inflammatory change. Musculoskeletal: Unremarkable. IMPRESSION: Significant gallbladder wall thickening and possible small amount of pericholecystic fluid. No significant adjacent inflammatory change and no definite stones or sludge. Recommend right upper quadrant ultrasound for further evaluation. 2 mm nonobstructing left renal stone. Small amount of nonspecific free fluid in the pelvis. Electronically Signed   By: Marin Olp M.D.   On: 03/19/2018 19:15   US Liver Doppler  Result Date: 03/19/2018 CLINICAL DATA:  Nausea and vomiting EXAM: DUPLEX ULTRASOUND OF LIVER ULTRASOUND ABDOMEN LIMITED RIGHT UPPER QUADRANT; TECHNIQUE: Color and duplex Doppler ultrasound was performed to evaluate the hepatic in-flow and out-flow vessels. COMPARISON:  None. FINDINGS: Gallbladder ultrasound: Gallbladder: Gallbladder wall edema with single wall thickness of the gallbladder of 9.1 mm. Several gallstones are also present, the largest measuring 2.2 cm. No sonographic Murphy's sign however. No pericholecystic fluid. Common bile duct: Diameter: 3.4 mm and normal Liver: No focal lesion identified. Within normal limits in  parenchymal echogenicity. Portal vein is patent on color Doppler imaging with normal direction of blood flow towards the liver. Liver Doppler ultrasound : Portal vein size: 1.2 cm Portal venous velocities: Proximal portal vein 24.2 cm/second and hepatopetal, mid portal vein velocity 64.7 cm and hepatopetal, and distal portal vein velocity 100 cm/second, also hepatopetal. Right portal vein velocity: 23.3 cm/second and hepatopetal. Left portal vein velocity 47.5 cm and hepatopetal Hepatic Vein  Velocities Right:  23.5  cm/sec and hepatofugal Middle:  35.1  cm/sec and hepatofugal Left: 25.5  cm/sec and hepatofugal Hepatic Artery Velocity:  99.8  cm/sec Splenic Vein Velocity:  33.1 cm/sec Varices: Absent Ascites: Absent No portal or splenic vein occlusion or thrombus. IMPRESSION: 1. Cholelithiasis with gallbladder wall thickening concerning for changes of cholecystitis, more likely chronic given lack of sonographic Murphy sign and other ancillary findings such as gallbladder distention and pericholecystic fluid. 2. Normal hepatopetal pedal flow towards the liver within the portal venous system and hepatofugal flow away from the liver within the hepatic venous system. 3. Absent varices, ascites, portal/splenic vein occlusion and thrombus. Electronically Signed   By: Ashley Royalty M.D.   On: 03/19/2018 23:25   US Abdomen Limited Ruq  Result Date: 03/19/2018 CLINICAL DATA:  Nausea and vomiting EXAM: DUPLEX ULTRASOUND OF LIVER ULTRASOUND ABDOMEN LIMITED RIGHT UPPER QUADRANT; TECHNIQUE: Color and duplex Doppler ultrasound was performed to evaluate the hepatic in-flow and out-flow vessels. COMPARISON:  None. FINDINGS: Gallbladder ultrasound: Gallbladder: Gallbladder wall edema with single wall thickness of the gallbladder of 9.1 mm. Several gallstones are also present, the largest measuring 2.2 cm. No sonographic Murphy's sign however. No pericholecystic fluid. Common bile duct: Diameter: 3.4 mm and normal Liver: No focal lesion identified. Within normal limits in parenchymal echogenicity. Portal vein is patent on color Doppler imaging with normal direction of blood flow towards the liver. Liver Doppler ultrasound : Portal vein size: 1.2 cm Portal venous velocities: Proximal portal vein 24.2 cm/second and hepatopetal, mid portal vein velocity 64.7 cm and hepatopetal, and distal portal vein velocity 100 cm/second, also hepatopetal. Right portal vein velocity: 23.3 cm/second and hepatopetal. Left portal vein  velocity 47.5 cm and hepatopetal Hepatic Vein Velocities Right:  23.5  cm/sec and hepatofugal Middle:  35.1  cm/sec and hepatofugal Left: 25.5  cm/sec and hepatofugal Hepatic Artery Velocity:  99.8  cm/sec Splenic Vein Velocity:  33.1 cm/sec Varices: Absent Ascites: Absent No portal or splenic vein occlusion or thrombus. IMPRESSION: 1. Cholelithiasis with gallbladder wall thickening concerning for changes of cholecystitis, more likely chronic given lack of sonographic Murphy sign and other ancillary findings such as gallbladder distention and pericholecystic fluid. 2. Normal hepatopetal pedal flow towards the liver within the portal venous system and hepatofugal flow away from the liver within the hepatic venous system. 3. Absent varices, ascites, portal/splenic vein occlusion and thrombus. Electronically Signed   By: Ashley Royalty M.D.   On: 03/19/2018 23:25        Scheduled Meds: . heparin  5,000 Units Subcutaneous Q8H   Continuous Infusions: . sodium chloride 125 mL/hr at 03/20/18 0115  . piperacillin-tazobactam (ZOSYN)  IV 3.375 g (03/20/18 0617)     LOS: 1 day        Aline August, MD [facility] Pager [phone]  If 7PM-7AM, please contact night-coverage [email or web address] Password [email or web address] 03/20/2018, 8:04 AM

## 2018-03-20 NOTE — Consult Note (Signed)
[facility] Surgery Consult Note  ASBURY HAIR 1986/06/19  [id number].    Requesting MD: Starla Link Chief Complaint/Reason for Consult: Cholelithiasis  HPI:  Patient is an otherwise healthy 32 year old male who presented to [facility] with 7 day hx of n/v and decreased PO intake. Patient came to the ED yesterday because he was "just tired of it". Emesis has been non-bloody, bilious. He reports associated abdominal pain, grabbing in character, mostly across the lower abdomen, non-radiating, nothing noted to improve or worsen. Patient is passing flatus but has not had a BM. Denies fever, chills, chest pain, SOB, palpitations, urinary symptoms. Patient denies sick contacts, recent travel, frequent acetaminophen use. Patient does not take any medications on a regular basis and denies past surgeries. He denies alcohol or illicit drug use, although urine drug screen positive for cocaine and amphetamines. He reports 1/2 -1 ppd cigarettes. NKDA.   ROS: Review of Systems  Constitutional: Negative for chills and fever.  Respiratory: Negative for shortness of breath.   Cardiovascular: Negative for chest pain and palpitations.  Gastrointestinal: Positive for abdominal pain, constipation, nausea and vomiting. Negative for diarrhea.  Genitourinary: Negative for dysuria, frequency and urgency.    Family History  Problem Relation Age of Onset  . Lung cancer Father     History reviewed. No pertinent past medical history.  Past Surgical History:  Procedure Laterality Date  . NO PAST SURGERIES      Social History:  reports that he has been smoking cigarettes.  He has been smoking about 0.50 packs per day. He has never used smokeless tobacco. He reports that he does not drink alcohol or use drugs.  Allergies: No Known Allergies  Medications Prior to Admission  Medication Sig Dispense Refill  . ibuprofen (ADVIL,MOTRIN) 600 MG tablet Take 1 tablet (600 mg total) by mouth every 6 (six) hours as needed for  pain. (Patient not taking: Reported on 03/19/2018) 30 tablet 0  . methocarbamol (ROBAXIN) 500 MG tablet Take 1 tablet (500 mg total) by mouth 2 (two) times daily. (Patient not taking: Reported on 03/19/2018) 20 tablet 0    Blood pressure 124/71, pulse 88, temperature 98.8 F (37.1 C), temperature source Oral, resp. rate 16, height _0  (1.753 m), weight 81.9 kg (180 lb 8.9 oz), SpO2 94 %. Physical Exam: Physical Exam  Constitutional: He is oriented to person, place, and time. He appears well-developed and well-nourished. He is cooperative.  Non-toxic appearance. No distress.  HENT:  Head: Normocephalic and atraumatic.  Right Ear: External ear normal.  Left Ear: External ear normal.  Nose: Nose normal.  Mouth/Throat: Mucous membranes are dry.  Eyes: Conjunctivae, EOM and lids are normal. Scleral icterus is present.  Pupils equal and round  Neck: Normal range of motion and phonation normal. Neck supple.  Cardiovascular: Normal rate and regular rhythm.  Pulses:      Radial pulses are 2+ on the right side, and 2+ on the left side.       Dorsalis pedis pulses are 2+ on the right side, and 2+ on the left side.  Pulmonary/Chest: Effort normal and breath sounds normal.  Abdominal: Soft. Bowel sounds are normal. He exhibits no distension and no fluid wave. There is no hepatosplenomegaly. There is tenderness in the right upper quadrant, right lower quadrant, suprapubic area and left lower quadrant. There is no rigidity, no rebound, no guarding and negative Murphy's sign. No hernia.  Musculoskeletal:  ROM grossly intact in bilateral upper and lower extremities  Neurological: He  is alert and oriented to person, place, and time. He has normal strength. No sensory deficit.  Skin: Skin is warm, dry and intact.  Slightly jaundiced  Psychiatric: His speech is normal and behavior is normal. His affect is blunt.    Results for orders placed or performed during the hospital encounter of 03/19/18 (from the  past 48 hour(s))  Lipase, blood     Status: None   Collection Time: 03/19/18  4:36 PM  Result Value Ref Range   Lipase 38 11 - 51 U/L    Comment: Performed at [facility], [place] [address] [place], [place] [address]  Comprehensive metabolic panel     Status: Abnormal   Collection Time: 03/19/18  4:36 PM  Result Value Ref Range   Sodium 134 (L) 135 - 145 mmol/L   Potassium 3.6 3.5 - 5.1 mmol/L   Chloride 96 (L) 101 - 111 mmol/L   CO2 24 22 - 32 mmol/L   Glucose, Bld 157 (H) 65 - 99 mg/dL   BUN 31 (H) 6 - 20 mg/dL   Creatinine, Ser 1.79 (H) 0.61 - 1.24 mg/dL   Calcium 9.1 8.9 - 10.3 mg/dL   Total Protein 6.8 6.5 - 8.1 g/dL   Albumin 3.5 3.5 - 5.0 g/dL   AST 2,421 (H) 15 - 41 U/L    Comment: RESULTS CONFIRMED BY MANUAL DILUTION   ALT 5,232 (H) 17 - 63 U/L    Comment: RESULTS CONFIRMED BY MANUAL DILUTION   Alkaline Phosphatase 190 (H) 38 - 126 U/L   Total Bilirubin 11.3 (H) 0.3 - 1.2 mg/dL   GFR calc non Af Amer 48 (L) >60 mL/min   GFR calc Af Amer 56 (L) >60 mL/min    Comment: (NOTE) The eGFR has been calculated using the CKD EPI equation. This calculation has not been validated in all clinical situations. eGFR's persistently <60 mL/min signify possible Chronic Kidney Disease.    Anion gap 14 5 - 15    Comment: Performed at [facility], [place] [address], [place], [place] [address]  CBC     Status: Abnormal   Collection Time: 03/19/18  4:36 PM  Result Value Ref Range   WBC 11.9 (H) 4.0 - 10.5 K/uL   RBC 5.94 (H) 4.22 - 5.81 MIL/uL   Hemoglobin 18.1 (H) 13.0 - 17.0 g/dL   HCT 50.0 39.0 - 52.0 %   MCV 84.2 78.0 - 100.0 fL   MCH 30.5 26.0 - 34.0 pg   MCHC 36.2 (H) 30.0 - 36.0 g/dL   RDW 13.5 11.5 - 15.5 %   Platelets 120 (L) 150 - 400 K/uL    Comment: Performed at [facility], [place] [address] [place], [place] [address]  Bilirubin, direct     Status: Abnormal   Collection Time: 03/19/18  4:36 PM  Result Value Ref Range    Bilirubin, Direct 6.8 (H) 0.1 - 0.5 mg/dL    Comment: Performed at [facility], [place] [address], [place], [place] [address]  I-Stat CG4 Lactic Acid, ED     Status: Abnormal   Collection Time: 03/19/18  4:43 PM  Result Value Ref Range   Lactic Acid, Venous 3.69 (HH) 0.5 - 1.9 mmol/L   Comment NOTIFIED PHYSICIAN   Ammonia     Status: None   Collection Time: 03/19/18  4:59 PM  Result Value Ref Range   Ammonia 28 9 - 35 umol/L    Comment: Performed at [facility]  [place] [address], [place], [place] [address]  Ethanol     Status: None   Collection Time: 03/19/18  4:59 PM  Result Value Ref Range   Alcohol, Ethyl (B) <10 <10 mg/dL    Comment:        LOWEST DETECTABLE LIMIT FOR SERUM ALCOHOL IS 10 mg/dL FOR MEDICAL PURPOSES ONLY Performed at [place] [address] [place], [place] [address]   Urinalysis, Routine w reflex microscopic     Status: Abnormal   Collection Time: 03/19/18  5:07 PM  Result Value Ref Range   Color, Urine AMBER (A) YELLOW    Comment: BIOCHEMICALS MAY BE AFFECTED BY COLOR   APPearance CLOUDY (A) CLEAR   Specific Gravity, Urine 1.020 1.005 - 1.030   pH 5.0 5.0 - 8.0   Glucose, UA NEGATIVE NEGATIVE mg/dL   Hgb urine dipstick SMALL (A) NEGATIVE   Bilirubin Urine MODERATE (A) NEGATIVE   Ketones, ur NEGATIVE NEGATIVE mg/dL   Protein, ur 30 (A) NEGATIVE mg/dL   Nitrite NEGATIVE NEGATIVE   Leukocytes, UA NEGATIVE NEGATIVE   RBC / HPF 0-5 0 - 5 RBC/hpf   WBC, UA 11-20 0 - 5 WBC/hpf   Bacteria, UA NONE SEEN NONE SEEN   Squamous Epithelial / LPF 0-5 0 - 5    Comment: Please note change in reference range.   Mucus PRESENT     Comment: Performed at [facility], [place] [address], [place], [place] [address]  Rapid urine drug screen (hospital performed)     Status: Abnormal   Collection Time: 03/19/18  5:07 PM  Result Value Ref Range   Opiates NONE DETECTED NONE DETECTED   Cocaine  POSITIVE (A) NONE DETECTED   Benzodiazepines NONE DETECTED NONE DETECTED   Amphetamines POSITIVE (A) NONE DETECTED   Tetrahydrocannabinol NONE DETECTED NONE DETECTED   Barbiturates NONE DETECTED NONE DETECTED    Comment: (NOTE) DRUG SCREEN FOR MEDICAL PURPOSES ONLY.  IF CONFIRMATION IS NEEDED FOR ANY PURPOSE, NOTIFY LAB WITHIN 5 DAYS. LOWEST DETECTABLE LIMITS FOR URINE DRUG SCREEN Drug Class                     Cutoff (ng/mL) Amphetamine and metabolites    1000 Barbiturate and metabolites    200 Benzodiazepine                 937 Tricyclics and metabolites     300 Opiates and metabolites        300 Cocaine and metabolites        300 THC                            50 Performed at [facility] [place] [address] [place], [place] [address]   I-Stat CG4 Lactic Acid, ED     Status: Abnormal   Collection Time: 03/19/18  7:13 PM  Result Value Ref Range   Lactic Acid, Venous 2.39 (HH) 0.5 - 1.9 mmol/L   Comment NOTIFIED PHYSICIAN   Acetaminophen level     Status: Abnormal   Collection Time: 03/19/18  9:28 PM  Result Value Ref Range   Acetaminophen (Tylenol), Serum <10 (L) 10 - 30 ug/mL    Comment:        THERAPEUTIC CONCENTRATIONS VARY SIGNIFICANTLY. A RANGE OF 10-30 ug/mL MAY BE AN EFFECTIVE CONCENTRATION FOR MANY PATIENTS. HOWEVER, SOME ARE BEST TREATED AT CONCENTRATIONS OUTSIDE THIS RANGE.  ACETAMINOPHEN CONCENTRATIONS >150 ug/mL AT 4 HOURS AFTER INGESTION AND >50 ug/mL AT 12 HOURS AFTER INGESTION ARE OFTEN ASSOCIATED WITH TOXIC REACTIONS. Performed at [facility], [place] [address], [place], [place] [address]   Protime-INR     Status: Abnormal   Collection Time: 03/19/18  9:31 PM  Result Value Ref Range   Prothrombin Time 15.5 (H) 11.4 - 15.2 seconds   INR 1.25     Comment: Performed at [facility], [place] [address], [place], [place] [address]  Mononucleosis screen     Status: None   Collection Time: 03/19/18  9:31  PM  Result Value Ref Range   Mono Screen NEGATIVE NEGATIVE    Comment: Performed at [facility], [place] [address], [place], [place] [address]  CK     Status: Abnormal   Collection Time: 03/19/18  9:31 PM  Result Value Ref Range   Total CK 30 (L) 49 - 397 U/L    Comment: Performed at [facility], [place] [address], [place], [place] [address]  Basic metabolic panel     Status: Abnormal   Collection Time: 03/20/18  1:42 AM  Result Value Ref Range   Sodium 133 (L) 135 - 145 mmol/L   Potassium 3.5 3.5 - 5.1 mmol/L   Chloride 100 (L) 101 - 111 mmol/L   CO2 20 (L) 22 - 32 mmol/L   Glucose, Bld 99 65 - 99 mg/dL   BUN 28 (H) 6 - 20 mg/dL   Creatinine, Ser 1.36 (H) 0.61 - 1.24 mg/dL   Calcium 8.5 (L) 8.9 - 10.3 mg/dL   GFR calc non Af Amer >60 >60 mL/min   GFR calc Af Amer >60 >60 mL/min    Comment: (NOTE) The eGFR has been calculated using the CKD EPI equation. This calculation has not been validated in all clinical situations. eGFR's persistently <60 mL/min signify possible Chronic Kidney Disease.    Anion gap 13 5 - 15    Comment: Performed at [facility], [place] [address], [place], [place] [address]  CBC     Status: Abnormal   Collection Time: 03/20/18  1:42 AM  Result Value Ref Range   WBC 15.8 (H) 4.0 - 10.5 K/uL   RBC 5.67 4.22 - 5.81 MIL/uL   Hemoglobin 16.7 13.0 - 17.0 g/dL   HCT 46.5 39.0 - 52.0 %   MCV 82.0 78.0 - 100.0 fL   MCH 29.5 26.0 - 34.0 pg   MCHC 35.9 30.0 - 36.0 g/dL   RDW 13.6 11.5 - 15.5 %   Platelets 113 (L) 150 - 400 K/uL    Comment: PLATELET COUNT CONFIRMED BY SMEAR SPECIMEN CHECKED FOR CLOTS Performed at [facility], [place] [address], [place], [place] [address]   Hepatic function panel     Status: Abnormal   Collection Time: 03/20/18  1:42 AM  Result Value Ref Range   Total Protein 6.2 (L) 6.5 - 8.1 g/dL   Albumin 3.1 (L) 3.5 - 5.0 g/dL   AST 1,431 (H) 15 - 41 U/L   ALT 3,914  (H) 17 - 63 U/L    Comment: RESULTS CONFIRMED BY MANUAL DILUTION   Alkaline Phosphatase 164 (H) 38 - 126 U/L   Total Bilirubin 10.6 (H) 0.3 - 1.2 mg/dL   Bilirubin, Direct 6.5 (H) 0.1 - 0.5 mg/dL   Indirect Bilirubin 4.1 (H) 0.3 - 0.9 mg/dL    Comment: Performed at [facility],  [place] [address], [place], [place] [address]  Lactate dehydrogenase     Status: Abnormal   Collection Time: 03/20/18  1:42 AM  Result Value Ref Range   LDH 234 (H) 98 - 192 U/L    Comment: Performed at [facility], [place] [address], [place], [place] [address]  DIC (disseminated intravasc coag) panel     Status: Abnormal   Collection Time: 03/20/18  1:42 AM  Result Value Ref Range   Prothrombin Time 15.6 (H) 11.4 - 15.2 seconds   INR 1.25    aPTT 41 (H) 24 - 36 seconds    Comment:        IF BASELINE aPTT IS ELEVATED, SUGGEST PATIENT RISK ASSESSMENT BE USED TO DETERMINE APPROPRIATE ANTICOAGULANT THERAPY.    Fibrinogen 171 (L) 210 - 475 mg/dL   D-Dimer, Quant 1.54 (H) 0.00 - 0.50 ug/mL-FEU    Comment: (NOTE) At the manufacturer cut-off of 0.50 ug/mL FEU, this assay has been documented to exclude PE with a sensitivity and negative predictive value of 97 to 99%.  At this time, this assay has not been approved by the FDA to exclude DVT/VTE. Results should be correlated with clinical presentation.    Platelets 113 (L) 150 - 400 K/uL    Comment: PLATELET COUNT CONFIRMED BY SMEAR NO SCHISTOCYTES SEEN CORRECTED ON 05/09 AT 0241: PREVIOUSLY REPORTED AS 109 PLATELET COUNT CONFIRMED BY SMEAR    Smear Review NO SCHISTOCYTES SEEN     Comment: Performed at [facility], [place] [address] [place], [place] [address]   Ct Abdomen Pelvis W Contrast  Result Date: 03/19/2018 CLINICAL DATA:  Abdominal pain with nausea and vomiting 7 days. EXAM: CT ABDOMEN AND PELVIS WITH CONTRAST TECHNIQUE: Multidetector CT imaging of the abdomen and pelvis was performed using the  standard protocol following bolus administration of intravenous contrast. CONTRAST:  173m ISOVUE-300 IOPAMIDOL (ISOVUE-300) INJECTION 61% COMPARISON:  12/21/2010 FINDINGS: Lower chest: Lung bases are normal. Couple adjacent right pericardiophrenic lymph nodes with the larger measuring 9 mm by short axis. Hepatobiliary: Liver and biliary tree are normal. Significant gallbladder wall thickening and possible small amount of pericholecystic fluid. No definite sludge or cholelithiasis. Pancreas: Normal. Spleen: Mild splenomegaly. Adrenals/Urinary Tract: Adrenal glands are normal. 2 mm stone over the upper pole collecting system of the left kidney. No right renal stones. No hydronephrosis. Ureters and bladder are normal. Stomach/Bowel: Stomach and small bowel are within normal. Appendix is normal. Colon is unremarkable. Vascular/Lymphatic: Abdominal aorta is normal. Remaining vascular structures are unremarkable. No adenopathy. Reproductive: Small amount of free fluid in the pelvis. Normal prostate. Other: No acute inflammatory change. Musculoskeletal: Unremarkable. IMPRESSION: Significant gallbladder wall thickening and possible small amount of pericholecystic fluid. No significant adjacent inflammatory change and no definite stones or sludge. Recommend right upper quadrant ultrasound for further evaluation. 2 mm nonobstructing left renal stone. Small amount of nonspecific free fluid in the pelvis. Electronically Signed   By: DMarin OlpM.D.   On: 03/19/2018 19:15   [place] Doppler  Result Date: 03/19/2018 CLINICAL DATA:  Nausea and vomiting EXAM: DUPLEX ULTRASOUND OF LIVER ULTRASOUND ABDOMEN LIMITED RIGHT UPPER QUADRANT; TECHNIQUE: Color and duplex Doppler ultrasound was performed to evaluate the hepatic in-flow and out-flow vessels. COMPARISON:  None. FINDINGS: Gallbladder ultrasound: Gallbladder: Gallbladder wall edema with single wall thickness of the gallbladder of 9.1 mm. Several gallstones are also  present, the largest measuring 2.2 cm. No sonographic Murphy's sign however. No pericholecystic fluid. Common bile duct: Diameter: 3.4  mm and normal Liver: No focal lesion identified. Within normal limits in parenchymal echogenicity. Portal vein is patent on color Doppler imaging with normal direction of blood flow towards the liver. Liver Doppler ultrasound : Portal vein size: 1.2 cm Portal venous velocities: Proximal portal vein 24.2 cm/second and hepatopetal, mid portal vein velocity 64.7 cm and hepatopetal, and distal portal vein velocity 100 cm/second, also hepatopetal. Right portal vein velocity: 23.3 cm/second and hepatopetal. Left portal vein velocity 47.5 cm and hepatopetal Hepatic Vein Velocities Right:  23.5  cm/sec and hepatofugal Middle:  35.1  cm/sec and hepatofugal Left: 25.5  cm/sec and hepatofugal Hepatic Artery Velocity:  99.8  cm/sec Splenic Vein Velocity:  33.1 cm/sec Varices: Absent Ascites: Absent No portal or splenic vein occlusion or thrombus. IMPRESSION: 1. Cholelithiasis with gallbladder wall thickening concerning for changes of cholecystitis, more likely chronic given lack of sonographic Murphy sign and other ancillary findings such as gallbladder distention and pericholecystic fluid. 2. Normal hepatopetal pedal flow towards the liver within the portal venous system and hepatofugal flow away from the liver within the hepatic venous system. 3. Absent varices, ascites, portal/splenic vein occlusion and thrombus. Electronically Signed   By: Ashley Royalty M.D.   On: 03/19/2018 23:25   US Abdomen Limited Ruq  Result Date: 03/19/2018 CLINICAL DATA:  Nausea and vomiting EXAM: DUPLEX ULTRASOUND OF LIVER ULTRASOUND ABDOMEN LIMITED RIGHT UPPER QUADRANT; TECHNIQUE: Color and duplex Doppler ultrasound was performed to evaluate the hepatic in-flow and out-flow vessels. COMPARISON:  None. FINDINGS: Gallbladder ultrasound: Gallbladder: Gallbladder wall edema with single wall thickness of the gallbladder  of 9.1 mm. Several gallstones are also present, the largest measuring 2.2 cm. No sonographic Murphy's sign however. No pericholecystic fluid. Common bile duct: Diameter: 3.4 mm and normal Liver: No focal lesion identified. Within normal limits in parenchymal echogenicity. Portal vein is patent on color Doppler imaging with normal direction of blood flow towards the liver. Liver Doppler ultrasound : Portal vein size: 1.2 cm Portal venous velocities: Proximal portal vein 24.2 cm/second and hepatopetal, mid portal vein velocity 64.7 cm and hepatopetal, and distal portal vein velocity 100 cm/second, also hepatopetal. Right portal vein velocity: 23.3 cm/second and hepatopetal. Left portal vein velocity 47.5 cm and hepatopetal Hepatic Vein Velocities Right:  23.5  cm/sec and hepatofugal Middle:  35.1  cm/sec and hepatofugal Left: 25.5  cm/sec and hepatofugal Hepatic Artery Velocity:  99.8  cm/sec Splenic Vein Velocity:  33.1 cm/sec Varices: Absent Ascites: Absent No portal or splenic vein occlusion or thrombus. IMPRESSION: 1. Cholelithiasis with gallbladder wall thickening concerning for changes of cholecystitis, more likely chronic given lack of sonographic Murphy sign and other ancillary findings such as gallbladder distention and pericholecystic fluid. 2. Normal hepatopetal pedal flow towards the liver within the portal venous system and hepatofugal flow away from the liver within the hepatic venous system. 3. Absent varices, ascites, portal/splenic vein occlusion and thrombus. Electronically Signed   By: Ashley Royalty M.D.   On: 03/19/2018 23:25      Assessment/Plan AKI - Cr 1.36, trending down Thrombocytopenia - plts 113, per primary service PSA  Transaminitis - AST 1,431, ALT 3,914, ALP 164, Tbili 10.6 - hepatitis panel pending, labs to r/o other causes pending - GI consult pending - consider MRCP Cholelithiasis with likely chronic cholecystitis - RUQ [place]: cholelithiasis with gallbladder wall thickening   - CT: gallbladder wall thickening, ?small amount pericholecystic fluid - WBC: 15.8, afebrile - continue IV zosyn - patient only mildly TTP - will likely require lap chole  at some point during this admission, but needs further workup of transaminitis first - we will follow for readiness  FEN: NPO, IVF VTE: SCDs ID: [id number] 5/9>>  Brigid Re, [facility] Surgery 03/20/2018, 7:48 AM Pager: [phone] Consults: [phone] Mon-Fri 7:00 am-4:30 pm Sat-Sun 7:00 am-11:30 am

## 2018-03-20 NOTE — ED Notes (Signed)
ED TO INPATIENT HANDOFF REPORT  Name/Age/Gender Jeffrey Wise 32 y.o. male  Code Status   Home/SNF/Other Home  Chief Complaint Emesis; dehydrated; abd pain   Level of Care/Admitting Diagnosis ED Disposition    ED Disposition Condition [facility] Area: [place] [[id number]]  Level of Care: Stepdown [14]  Admit to SDU based on following criteria: Hemodynamic compromise or significant risk of instability:  Patient requiring short term acute titration and management of vasoactive drips, and invasive monitoring (i.e., CVP and Arterial line).  Diagnosis: Elevated LFTs [321910]  Admitting Physician: Cristy Folks [[id number]]  Attending Physician: Cristy Folks [[id number]]  Estimated length of stay: past midnight tomorrow  Certification:: I certify this patient will need inpatient services for at least 2 midnights  PT Class (Do Not Modify): Inpatient [101]  PT Acc Code (Do Not Modify): Private [1]       Medical History History reviewed. No pertinent past medical history.  Allergies No Known Allergies  IV Location/Drains/Wounds Patient Lines/Drains/Airways Status   Active Line/Drains/Airways    Name:   Placement date:   Placement time:   Site:   Days:   Peripheral IV 03/19/18 Left Other (Comment)   03/19/18    1658    Other (Comment)   1          Labs/Imaging Results for orders placed or performed during the hospital encounter of 03/19/18 (from the past 48 hour(s))  Lipase, blood     Status: None   Collection Time: 03/19/18  4:36 PM  Result Value Ref Range   Lipase 38 11 - 51 U/L    Comment: Performed at [facility], [place] [address] [place], [place] [address]  Comprehensive metabolic panel     Status: Abnormal   Collection Time: 03/19/18  4:36 PM  Result Value Ref Range   Sodium 134 (L) 135 - 145 mmol/L   Potassium 3.6 3.5 - 5.1 mmol/L   Chloride 96 (L) 101 - 111 mmol/L   CO2 24 22 - 32 mmol/L   Glucose, Bld  157 (H) 65 - 99 mg/dL   BUN 31 (H) 6 - 20 mg/dL   Creatinine, Ser 1.79 (H) 0.61 - 1.24 mg/dL   Calcium 9.1 8.9 - 10.3 mg/dL   Total Protein 6.8 6.5 - 8.1 g/dL   Albumin 3.5 3.5 - 5.0 g/dL   AST 2,421 (H) 15 - 41 U/L    Comment: RESULTS CONFIRMED BY MANUAL DILUTION   ALT 5,232 (H) 17 - 63 U/L    Comment: RESULTS CONFIRMED BY MANUAL DILUTION   Alkaline Phosphatase 190 (H) 38 - 126 U/L   Total Bilirubin 11.3 (H) 0.3 - 1.2 mg/dL   GFR calc non Af Amer 48 (L) >60 mL/min   GFR calc Af Amer 56 (L) >60 mL/min    Comment: (NOTE) The eGFR has been calculated using the CKD EPI equation. This calculation has not been validated in all clinical situations. eGFR's persistently <60 mL/min signify possible Chronic Kidney Disease.    Anion gap 14 5 - 15    Comment: Performed at [facility], [place] [address] [place], [place] [address]  CBC     Status: Abnormal   Collection Time: 03/19/18  4:36 PM  Result Value Ref Range   WBC 11.9 (H) 4.0 - 10.5 K/uL   RBC 5.94 (H) 4.22 - 5.81 MIL/uL   Hemoglobin 18.1 (H) 13.0 - 17.0 g/dL   HCT 50.0 39.0 -  52.0 %   MCV 84.2 78.0 - 100.0 fL   MCH 30.5 26.0 - 34.0 pg   MCHC 36.2 (H) 30.0 - 36.0 g/dL   RDW 13.5 11.5 - 15.5 %   Platelets 120 (L) 150 - 400 K/uL    Comment: Performed at [facility], [place] [address] [place], [place] [address]  Bilirubin, direct     Status: Abnormal   Collection Time: 03/19/18  4:36 PM  Result Value Ref Range   Bilirubin, Direct 6.8 (H) 0.1 - 0.5 mg/dL    Comment: Performed at [facility], [place] [address], [place], [place] [address]  I-Stat CG4 Lactic Acid, ED     Status: Abnormal   Collection Time: 03/19/18  4:43 PM  Result Value Ref Range   Lactic Acid, Venous 3.69 (HH) 0.5 - 1.9 mmol/L   Comment NOTIFIED PHYSICIAN   Ammonia     Status: None   Collection Time: 03/19/18  4:59 PM  Result Value Ref Range   Ammonia 28 9 - 35 umol/L    Comment: Performed at [facility], [place] [address] [place], [place] [address]  Ethanol     Status: None   Collection Time: 03/19/18  4:59 PM  Result Value Ref Range   Alcohol, Ethyl (B) <10 <10 mg/dL    Comment:        LOWEST DETECTABLE LIMIT FOR SERUM ALCOHOL IS 10 mg/dL FOR MEDICAL PURPOSES ONLY Performed at [place] [address], [place], [place] [address]   Urinalysis, Routine w reflex microscopic     Status: Abnormal   Collection Time: 03/19/18  5:07 PM  Result Value Ref Range   Color, Urine AMBER (A) YELLOW    Comment: BIOCHEMICALS MAY BE AFFECTED BY COLOR   APPearance CLOUDY (A) CLEAR   Specific Gravity, Urine 1.020 1.005 - 1.030   pH 5.0 5.0 - 8.0   Glucose, UA NEGATIVE NEGATIVE mg/dL   Hgb urine dipstick SMALL (A) NEGATIVE   Bilirubin Urine MODERATE (A) NEGATIVE   Ketones, ur NEGATIVE NEGATIVE mg/dL   Protein, ur 30 (A) NEGATIVE mg/dL   Nitrite NEGATIVE NEGATIVE   Leukocytes, UA NEGATIVE NEGATIVE   RBC / HPF 0-5 0 - 5 RBC/hpf   WBC, UA 11-20 0 - 5 WBC/hpf   Bacteria, UA NONE SEEN NONE SEEN   Squamous Epithelial / LPF 0-5 0 - 5    Comment: Please note change in reference range.   Mucus PRESENT     Comment: Performed at [facility], [place] [address], [place], [place] [address]  Rapid urine drug screen (hospital performed)     Status: Abnormal   Collection Time: 03/19/18  5:07 PM  Result Value Ref Range   Opiates NONE DETECTED NONE DETECTED   Cocaine POSITIVE (A) NONE DETECTED   Benzodiazepines NONE DETECTED NONE DETECTED   Amphetamines POSITIVE (A) NONE DETECTED   Tetrahydrocannabinol NONE DETECTED NONE DETECTED   Barbiturates NONE DETECTED NONE DETECTED    Comment: (NOTE) DRUG SCREEN FOR MEDICAL PURPOSES ONLY.  IF CONFIRMATION IS NEEDED FOR ANY PURPOSE, NOTIFY LAB WITHIN 5 DAYS. LOWEST DETECTABLE LIMITS FOR URINE DRUG SCREEN Drug Class                     Cutoff (ng/mL) Amphetamine and metabolites    1000 Barbiturate and  metabolites    200 Benzodiazepine  035 Tricyclics and metabolites     300 Opiates and metabolites        300 Cocaine and metabolites        300 THC                            50 Performed at [facility], [place] [address], [place], [place] [address]   I-Stat CG4 Lactic Acid, ED     Status: Abnormal   Collection Time: 03/19/18  7:13 PM  Result Value Ref Range   Lactic Acid, Venous 2.39 (HH) 0.5 - 1.9 mmol/L   Comment NOTIFIED PHYSICIAN   Acetaminophen level     Status: Abnormal   Collection Time: 03/19/18  9:28 PM  Result Value Ref Range   Acetaminophen (Tylenol), Serum <10 (L) 10 - 30 ug/mL    Comment:        THERAPEUTIC CONCENTRATIONS VARY SIGNIFICANTLY. A RANGE OF 10-30 ug/mL MAY BE AN EFFECTIVE CONCENTRATION FOR MANY PATIENTS. HOWEVER, SOME ARE BEST TREATED AT CONCENTRATIONS OUTSIDE THIS RANGE. ACETAMINOPHEN CONCENTRATIONS >150 ug/mL AT 4 HOURS AFTER INGESTION AND >50 ug/mL AT 12 HOURS AFTER INGESTION ARE OFTEN ASSOCIATED WITH TOXIC REACTIONS. Performed at [facility], [place] [address] [place], [place] [address]   Protime-INR     Status: Abnormal   Collection Time: 03/19/18  9:31 PM  Result Value Ref Range   Prothrombin Time 15.5 (H) 11.4 - 15.2 seconds   INR 1.25     Comment: Performed at [facility], [place] [address] [place], [place] [address]  Mononucleosis screen     Status: None   Collection Time: 03/19/18  9:31 PM  Result Value Ref Range   Mono Screen NEGATIVE NEGATIVE    Comment: Performed at [facility], [place] [address], [place], [place] [address]  CK     Status: Abnormal   Collection Time: 03/19/18  9:31 PM  Result Value Ref Range   Total CK 30 (L) 49 - 397 U/L    Comment: Performed at [facility], [place] [address], [place], [place] [address]   Ct Abdomen Pelvis W Contrast  Result Date: 03/19/2018 CLINICAL DATA:  Abdominal pain with nausea and  vomiting 7 days. EXAM: CT ABDOMEN AND PELVIS WITH CONTRAST TECHNIQUE: Multidetector CT imaging of the abdomen and pelvis was performed using the standard protocol following bolus administration of intravenous contrast. CONTRAST:  151m ISOVUE-300 IOPAMIDOL (ISOVUE-300) INJECTION 61% COMPARISON:  12/21/2010 FINDINGS: Lower chest: Lung bases are normal. Couple adjacent right pericardiophrenic lymph nodes with the larger measuring 9 mm by short axis. Hepatobiliary: Liver and biliary tree are normal. Significant gallbladder wall thickening and possible small amount of pericholecystic fluid. No definite sludge or cholelithiasis. Pancreas: Normal. Spleen: Mild splenomegaly. Adrenals/Urinary Tract: Adrenal glands are normal. 2 mm stone over the upper pole collecting system of the left kidney. No right renal stones. No hydronephrosis. Ureters and bladder are normal. Stomach/Bowel: Stomach and small bowel are within normal. Appendix is normal. Colon is unremarkable. Vascular/Lymphatic: Abdominal aorta is normal. Remaining vascular structures are unremarkable. No adenopathy. Reproductive: Small amount of free fluid in the pelvis. Normal prostate. Other: No acute inflammatory change. Musculoskeletal: Unremarkable. IMPRESSION: Significant gallbladder wall thickening and possible small amount of pericholecystic fluid. No significant adjacent inflammatory change and no definite stones or sludge. Recommend right upper quadrant ultrasound for further evaluation. 2 mm nonobstructing left renal stone. Small amount of nonspecific free fluid in  the pelvis. Electronically Signed   By: Marin Olp M.D.   On: 03/19/2018 19:15   US Liver Doppler  Result Date: 03/19/2018 CLINICAL DATA:  Nausea and vomiting EXAM: DUPLEX ULTRASOUND OF LIVER ULTRASOUND ABDOMEN LIMITED RIGHT UPPER QUADRANT; TECHNIQUE: Color and duplex Doppler ultrasound was performed to evaluate the hepatic in-flow and out-flow vessels. COMPARISON:  None. FINDINGS:  Gallbladder ultrasound: Gallbladder: Gallbladder wall edema with single wall thickness of the gallbladder of 9.1 mm. Several gallstones are also present, the largest measuring 2.2 cm. No sonographic Murphy's sign however. No pericholecystic fluid. Common bile duct: Diameter: 3.4 mm and normal Liver: No focal lesion identified. Within normal limits in parenchymal echogenicity. Portal vein is patent on color Doppler imaging with normal direction of blood flow towards the liver. Liver Doppler ultrasound : Portal vein size: 1.2 cm Portal venous velocities: Proximal portal vein 24.2 cm/second and hepatopetal, mid portal vein velocity 64.7 cm and hepatopetal, and distal portal vein velocity 100 cm/second, also hepatopetal. Right portal vein velocity: 23.3 cm/second and hepatopetal. Left portal vein velocity 47.5 cm and hepatopetal Hepatic Vein Velocities Right:  23.5  cm/sec and hepatofugal Middle:  35.1  cm/sec and hepatofugal Left: 25.5  cm/sec and hepatofugal Hepatic Artery Velocity:  99.8  cm/sec Splenic Vein Velocity:  33.1 cm/sec Varices: Absent Ascites: Absent No portal or splenic vein occlusion or thrombus. IMPRESSION: 1. Cholelithiasis with gallbladder wall thickening concerning for changes of cholecystitis, more likely chronic given lack of sonographic Murphy sign and other ancillary findings such as gallbladder distention and pericholecystic fluid. 2. Normal hepatopetal pedal flow towards the liver within the portal venous system and hepatofugal flow away from the liver within the hepatic venous system. 3. Absent varices, ascites, portal/splenic vein occlusion and thrombus. Electronically Signed   By: Ashley Royalty M.D.   On: 03/19/2018 23:25   US Abdomen Limited Ruq  Result Date: 03/19/2018 CLINICAL DATA:  Nausea and vomiting EXAM: DUPLEX ULTRASOUND OF LIVER ULTRASOUND ABDOMEN LIMITED RIGHT UPPER QUADRANT; TECHNIQUE: Color and duplex Doppler ultrasound was performed to evaluate the hepatic in-flow and  out-flow vessels. COMPARISON:  None. FINDINGS: Gallbladder ultrasound: Gallbladder: Gallbladder wall edema with single wall thickness of the gallbladder of 9.1 mm. Several gallstones are also present, the largest measuring 2.2 cm. No sonographic Murphy's sign however. No pericholecystic fluid. Common bile duct: Diameter: 3.4 mm and normal Liver: No focal lesion identified. Within normal limits in parenchymal echogenicity. Portal vein is patent on color Doppler imaging with normal direction of blood flow towards the liver. Liver Doppler ultrasound : Portal vein size: 1.2 cm Portal venous velocities: Proximal portal vein 24.2 cm/second and hepatopetal, mid portal vein velocity 64.7 cm and hepatopetal, and distal portal vein velocity 100 cm/second, also hepatopetal. Right portal vein velocity: 23.3 cm/second and hepatopetal. Left portal vein velocity 47.5 cm and hepatopetal Hepatic Vein Velocities Right:  23.5  cm/sec and hepatofugal Middle:  35.1  cm/sec and hepatofugal Left: 25.5  cm/sec and hepatofugal Hepatic Artery Velocity:  99.8  cm/sec Splenic Vein Velocity:  33.1 cm/sec Varices: Absent Ascites: Absent No portal or splenic vein occlusion or thrombus. IMPRESSION: 1. Cholelithiasis with gallbladder wall thickening concerning for changes of cholecystitis, more likely chronic given lack of sonographic Murphy sign and other ancillary findings such as gallbladder distention and pericholecystic fluid. 2. Normal hepatopetal pedal flow towards the liver within the portal venous system and hepatofugal flow away from the liver within the hepatic venous system. 3. Absent varices, ascites, portal/splenic vein occlusion and thrombus. Electronically Signed  By: Ashley Royalty M.D.   On: 03/19/2018 23:25    Pending Labs Unresulted Labs (From admission, onward)   Start     Ordered   03/19/18 2103  Herpes simplex virus (HSV), DNA by PCR Blood  Once,   R     03/19/18 2102   03/19/18 2102  IgG  Once,   R     03/19/18 2102    03/19/18 2102  Anti-smooth muscle antibody, IgG  Once,   R     03/19/18 2102   03/19/18 2052  Ceruloplasmin  Once,   R     03/19/18 2051   03/19/18 2050  Antinuclear Antibodies, IFA  Once,   R     03/19/18 2051   03/19/18 2047  Hepatitis panel, acute  STAT,   STAT     03/19/18 2046   03/19/18 1702  Blood Culture (routine x 2)  BLOOD CULTURE X 2,   STAT     03/19/18 1702   Signed and Held  HIV antibody (Routine Testing)  Once,   R     Signed and Held   Signed and Held  Basic metabolic panel  Tomorrow morning,   R     Signed and Held   Signed and Held  CBC  Tomorrow morning,   R     Signed and Held   Signed and Held  Protime-INR  Tomorrow morning,   R     Signed and Held   Signed and Held  Hepatic function panel  Tomorrow morning,   R     Signed and Held   Visual merchandiser and Held  CMV DNA, quantitative, PCR  Once,   R     Signed and Held   Visual merchandiser and Held  Lactate dehydrogenase  Once,   R     Signed and Held   Signed and Held  AntiMicrosomal Ab-Liver / Kidney  Once,   R     Signed and Held   Signed and Held  DIC (disseminated intravasc coag) panel  Add-on,   R     Signed and Held      Vitals/Pain Today's Vitals   03/19/18 2120 03/19/18 2313 03/19/18 2322 03/20/18 0018  BP:  118/81  119/80  Pulse:  93  78  Resp:  (!) 23  18  Temp:      TempSrc:      SpO2:  98%  100%  Weight:      Height:      PainSc: 10-Worst pain ever  8      Isolation Precautions No active isolations  Medications Medications  iopamidol (ISOVUE-300) 61 % injection (has no administration in time range)  0.9 %  sodium chloride infusion ( Intravenous Stopped 03/19/18 1813)  piperacillin-tazobactam (ZOSYN) IVPB 3.375 g (0 g Intravenous Stopped 03/19/18 1803)  ondansetron (ZOFRAN) injection 4 mg (4 mg Intravenous Given 03/19/18 1733)  iopamidol (ISOVUE-300) 61 % injection 100 mL (100 mLs Intravenous Contrast Given 03/19/18 1827)  morphine 4 MG/ML injection 4 mg (4 mg Intravenous Given 03/19/18 2125)  sodium chloride  0.9 % bolus 1,000 mL (0 mLs Intravenous Stopped 03/20/18 0018)    Mobility walks

## 2018-03-20 NOTE — Consult Note (Addendum)
Referring Provider: [facility]  Primary Care Physician:  Patient, No Pcp Per Primary Gastroenterologist:  None, unassigned  Reason for Consultation:  Abnormal liver chemistries    ASSESSMENT AND PLAN:    17. 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 Tylenol level negative. Mono screen negative. Liver chemistries improved overnight. INR remains stable at 1.25. Glucose 99. No mental status changes.  -supportive care. He is improving but monitor INR  / mental status. -await viral hepatitis panel  -discussed situation with patient. He understands that liver injury and AKI likely result of drug use and that these drugs , or whatever they are combined with can and do often cause permanent liver / kidney damage and even death.   2. Cholelithiasis, gb wall thickening and ? Small amount of pericholecystic fluid on imaging. WBC 15.8 -Surgery following, may need eventually cholecystectomy. He is getting Zosyn  3. Acute thrombocytopenia, stable. Monitor for now.   4. Chronic back pain, untreated at this point   HPI: Jeffrey Wise is a 32 y.o. male who presented to ED yesterday with upper abdominal pain, N/V for last several days Found to have AKI and severely abnormal liver labs. UDS + for cocaine and amphetamines. Patient says he has been smoking cocaine. Denies IVD use. Doesn't takes any pills. No OTC pain relievers. No recent unprotected sex. 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 No recent tatoos. No Hettinger of liver disease. No chronic GI complaints such as GERD, constipation, diarrhea. No blood in stool. No unusual weight loss. He has felt weak over last few day, almost "passed out" at one point. No fevers.   ED:  VSS Elevated lactic acid.  Met sepsis criteria.  WBC 11.9, Hgb 18  BUN 31, Cr 1.79 Alk pho 190, AST 2400, ALT 5230, Tbili 11.3, 6.8 direct  bili.  INR 1.25 Viral hep panel pending UTD -   + cocaine, amphetamines CT scan - significant GB wall thickening, maybe some pericholecystic fluid U/S -cholelithiasis , gb wall thickening.     History reviewed. No pertinent past medical history.  Past Surgical History:  Procedure Laterality Date  . NO PAST SURGERIES      Prior to Admission medications   Medication Sig Start Date End Date Taking? Authorizing Provider  ibuprofen (ADVIL,MOTRIN) 600 MG tablet Take 1 tablet (600 mg total) by mouth every 6 (six) hours as needed for pain. Patient not taking: Reported on 03/19/2018 08/11/13   Junius Creamer, NP  methocarbamol (ROBAXIN) 500 MG tablet Take 1 tablet (500 mg total) by mouth 2 (two) times daily. Patient not taking: Reported on 03/19/2018 08/11/13   Junius Creamer, NP    Current Facility-Administered Medications  Medication Dose Route Frequency Provider Last Rate Last Dose  . 0.9 %  sodium chloride infusion   Intravenous Continuous Purohit, Shrey C, MD 125 mL/hr at 03/20/18 0115    . heparin injection 5,000 Units  5,000 Units Subcutaneous Q8H Purohit, Konrad Dolores, MD   5,000 Units at 03/20/18 0616  . ondansetron (ZOFRAN) tablet 4 mg  4 mg Oral Q6H PRN Purohit, Shrey C, MD       Or  . ondansetron (ZOFRAN) injection 4 mg  4 mg Intravenous Q6H PRN Purohit, Konrad Dolores, MD   4 mg at 03/20/18 0128  . oxyCODONE (Oxy IR/ROXICODONE) immediate release tablet 5 mg  5 mg Oral Q4H PRN Purohit,  Konrad Dolores, MD   5 mg at 03/20/18 0128  . piperacillin-tazobactam (ZOSYN) IVPB 3.375 g  3.375 g Intravenous Q8H Purohit, Konrad Dolores, MD   Stopped at 03/20/18 1017  . polyethylene glycol (MIRALAX / GLYCOLAX) packet 17 g  17 g Oral Daily PRN Purohit, Konrad Dolores, MD        Allergies as of 03/19/2018  . (No Known Allergies)    Family History  Problem Relation Age of Onset  . Lung cancer Father     Social History   Socioeconomic History  . Marital status: Single    Spouse name: Not on file  . Number of children: Not on  file  . Years of education: Not on file  . Highest education level: Not on file  Occupational History  . Not on file  Social Needs  . Financial resource strain: Not on file  . Food insecurity:    Worry: Not on file    Inability: Not on file  . Transportation needs:    Medical: Not on file    Non-medical: Not on file  Tobacco Use  . Smoking status: Current Every Day Smoker    Packs/day: 0.50    Types: Cigarettes  . Smokeless tobacco: Never Used  Substance and Sexual Activity  . Alcohol use: Never    Frequency: Never  . Drug use: Never  . Sexual activity: Not on file  Lifestyle  . Physical activity:    Days per week: Not on file    Minutes per session: Not on file  . Stress: Not on file  Relationships  . Social connections:    Talks on phone: Not on file    Gets together: Not on file    Attends religious service: Not on file    Active member of club or organization: Not on file    Attends meetings of clubs or organizations: Not on file    Relationship status: Not on file  . Intimate partner violence:    Fear of current or ex partner: Not on file    Emotionally abused: Not on file    Physically abused: Not on file    Forced sexual activity: Not on file  Other Topics Concern  . Not on file  Social History Narrative  . Not on file    Review of Systems: All systems reviewed and negative except where noted in HPI.  Physical Exam: Vital signs in last 24 hours: Temp:  [97.4 F (36.3 C)-98.8 F (37.1 C)] 98.6 F (37 C) (05/09 0800) Pulse Rate:  [73-111] 88 (05/09 0100) Resp:  [16-23] 18 (05/09 0900) BP: (95-155)/(68-84) 155/84 (05/09 0900) SpO2:  [94 %-100 %] 94 % (05/09 0900) Weight:  [140 lb (63.5 kg)-180 lb 8.9 oz (81.9 kg)] 180 lb 8.9 oz (81.9 kg) (05/09 0100)   General:   Alert, well-developed, white male in NAD Psych:  Pleasant, cooperative. Normal mood and affect. Eyes:  Pupils equal, sclera icterus.  Conjunctiva pink. Ears:  Normal auditory  acuity. Nose:  No deformity, discharge,  or lesions. Neck:  Supple; no masses Lungs:  Clear throughout to auscultation.   No wheezes, crackles, or rhonchi.  Heart:  Regular rate and rhythm; no murmurs, no edema Abdomen:  Soft, non-distended, mild epigastric/ RUQ tenderness, BS active, no palp mass    Rectal:  Deferred  Msk:  Symmetrical without gross deformities. . Neurologic:  Alert and  oriented x4;  grossly normal neurologically. Skin:  Intact without significant lesions or rashes.Marland Kitchen  Intake/Output from previous day: 05/08 0701 - 05/09 0700 In: 2443.8 [I.V.:1343.8; IV Piggyback:1100] Out: -  Intake/Output this shift: Total I/O In: 50 [IV Piggyback:50] Out: -   Lab Results: Recent Labs    03/19/18 1636 03/20/18 0142  WBC 11.9* 15.8*  HGB 18.1* 16.7  HCT 50.0 46.5  PLT 120* 113*  113*   BMET Recent Labs    03/19/18 1636 03/20/18 0142  NA 134* 133*  K 3.6 3.5  CL 96* 100*  CO2 24 20*  GLUCOSE 157* 99  BUN 31* 28*  CREATININE 1.79* 1.36*  CALCIUM 9.1 8.5*   LFT Recent Labs    03/20/18 0142  PROT 6.2*  ALBUMIN 3.1*  AST 1,431*  ALT 3,914*  ALKPHOS 164*  BILITOT 10.6*  BILIDIR 6.5*  IBILI 4.1*   PT/INR Recent Labs    03/19/18 2131 03/20/18 0142  LABPROT 15.5* 15.6*  INR 1.25 1.25   Hepatitis Panel No results for input(s): HEPBSAG, HCVAB, HEPAIGM, HEPBIGM in the last 72 hours.   Studies/Results: Ct Abdomen Pelvis W Contrast  Result Date: 03/19/2018 CLINICAL DATA:  Abdominal pain with nausea and vomiting 7 days. EXAM: CT ABDOMEN AND PELVIS WITH CONTRAST TECHNIQUE: Multidetector CT imaging of the abdomen and pelvis was performed using the standard protocol following bolus administration of intravenous contrast. CONTRAST:  149m ISOVUE-300 IOPAMIDOL (ISOVUE-300) INJECTION 61% COMPARISON:  12/21/2010 FINDINGS: Lower chest: Lung bases are normal. Couple adjacent right pericardiophrenic lymph nodes with the larger measuring 9 mm by short axis.  Hepatobiliary: Liver and biliary tree are normal. Significant gallbladder wall thickening and possible small amount of pericholecystic fluid. No definite sludge or cholelithiasis. Pancreas: Normal. Spleen: Mild splenomegaly. Adrenals/Urinary Tract: Adrenal glands are normal. 2 mm stone over the upper pole collecting system of the left kidney. No right renal stones. No hydronephrosis. Ureters and bladder are normal. Stomach/Bowel: Stomach and small bowel are within normal. Appendix is normal. Colon is unremarkable. Vascular/Lymphatic: Abdominal aorta is normal. Remaining vascular structures are unremarkable. No adenopathy. Reproductive: Small amount of free fluid in the pelvis. Normal prostate. Other: No acute inflammatory change. Musculoskeletal: Unremarkable. IMPRESSION: Significant gallbladder wall thickening and possible small amount of pericholecystic fluid. No significant adjacent inflammatory change and no definite stones or sludge. Recommend right upper quadrant ultrasound for further evaluation. 2 mm nonobstructing left renal stone. Small amount of nonspecific free fluid in the pelvis. Electronically Signed   By: DMarin OlpM.D.   On: 03/19/2018 19:15   [place] Doppler  Result Date: 03/19/2018 CLINICAL DATA:  Nausea and vomiting EXAM: DUPLEX ULTRASOUND OF LIVER ULTRASOUND ABDOMEN LIMITED RIGHT UPPER QUADRANT; TECHNIQUE: Color and duplex Doppler ultrasound was performed to evaluate the hepatic in-flow and out-flow vessels. COMPARISON:  None. FINDINGS: Gallbladder ultrasound: Gallbladder: Gallbladder wall edema with single wall thickness of the gallbladder of 9.1 mm. Several gallstones are also present, the largest measuring 2.2 cm. No sonographic Murphy's sign however. No pericholecystic fluid. Common bile duct: Diameter: 3.4 mm and normal Liver: No focal lesion identified. Within normal limits in parenchymal echogenicity. Portal vein is patent on color Doppler imaging with normal direction of blood  flow towards the liver. Liver Doppler ultrasound : Portal vein size: 1.2 cm Portal venous velocities: Proximal portal vein 24.2 cm/second and hepatopetal, mid portal vein velocity 64.7 cm and hepatopetal, and distal portal vein velocity 100 cm/second, also hepatopetal. Right portal vein velocity: 23.3 cm/second and hepatopetal. Left portal vein velocity 47.5 cm and hepatopetal Hepatic Vein Velocities Right:  23.5  cm/sec and hepatofugal  Middle:  35.1  cm/sec and hepatofugal Left: 25.5  cm/sec and hepatofugal Hepatic Artery Velocity:  99.8  cm/sec Splenic Vein Velocity:  33.1 cm/sec Varices: Absent Ascites: Absent No portal or splenic vein occlusion or thrombus. IMPRESSION: 1. Cholelithiasis with gallbladder wall thickening concerning for changes of cholecystitis, more likely chronic given lack of sonographic Murphy sign and other ancillary findings such as gallbladder distention and pericholecystic fluid. 2. Normal hepatopetal pedal flow towards the liver within the portal venous system and hepatofugal flow away from the liver within the hepatic venous system. 3. Absent varices, ascites, portal/splenic vein occlusion and thrombus. Electronically Signed   By: Ashley Royalty M.D.   On: 03/19/2018 23:25   US Abdomen Limited Ruq  Result Date: 03/19/2018 CLINICAL DATA:  Nausea and vomiting EXAM: DUPLEX ULTRASOUND OF LIVER ULTRASOUND ABDOMEN LIMITED RIGHT UPPER QUADRANT; TECHNIQUE: Color and duplex Doppler ultrasound was performed to evaluate the hepatic in-flow and out-flow vessels. COMPARISON:  None. FINDINGS: Gallbladder ultrasound: Gallbladder: Gallbladder wall edema with single wall thickness of the gallbladder of 9.1 mm. Several gallstones are also present, the largest measuring 2.2 cm. No sonographic Murphy's sign however. No pericholecystic fluid. Common bile duct: Diameter: 3.4 mm and normal Liver: No focal lesion identified. Within normal limits in parenchymal echogenicity. Portal vein is patent on color Doppler  imaging with normal direction of blood flow towards the liver. Liver Doppler ultrasound : Portal vein size: 1.2 cm Portal venous velocities: Proximal portal vein 24.2 cm/second and hepatopetal, mid portal vein velocity 64.7 cm and hepatopetal, and distal portal vein velocity 100 cm/second, also hepatopetal. Right portal vein velocity: 23.3 cm/second and hepatopetal. Left portal vein velocity 47.5 cm and hepatopetal Hepatic Vein Velocities Right:  23.5  cm/sec and hepatofugal Middle:  35.1  cm/sec and hepatofugal Left: 25.5  cm/sec and hepatofugal Hepatic Artery Velocity:  99.8  cm/sec Splenic Vein Velocity:  33.1 cm/sec Varices: Absent Ascites: Absent No portal or splenic vein occlusion or thrombus. IMPRESSION: 1. Cholelithiasis with gallbladder wall thickening concerning for changes of cholecystitis, more likely chronic given lack of sonographic Murphy sign and other ancillary findings such as gallbladder distention and pericholecystic fluid. 2. Normal hepatopetal pedal flow towards the liver within the portal venous system and hepatofugal flow away from the liver within the hepatic venous system. 3. Absent varices, ascites, portal/splenic vein occlusion and thrombus. Electronically Signed   By: Ashley Royalty M.D.   On: 03/19/2018 23:25     Tye Savoy, NP-C @  03/20/2018, 11:04 AM  Pager number [phone]   Attending physician's note   I have taken a history, examined the patient and reviewed the chart. I agree with the Advanced Practitioner's note, impression and recommendations. 32 year old male admitted with mixed pattern of LFT abnormality, transaminitis and elevated bilirubin concerning for drug-induced liver injury or acute hepatitis History of cocaine abuse, drug tox screen positive for cocaine and amphetamine Denies any recent antibiotics, over-the-counter herbal remedies or Tylenol use.  Denies excessive use of alcohol INR 1.25, stable and no evidence of hepatic encephalopathy Follow-up  hepatitis A, B and C panel Infectious mononucleosis negative  Raliegh Ip Denzil Magnuson , MD [phone]

## 2018-03-21 DIAGNOSIS — K8080 Other cholelithiasis without obstruction: Secondary | ICD-10-CM

## 2018-03-21 DIAGNOSIS — R112 Nausea with vomiting, unspecified: Secondary | ICD-10-CM

## 2018-03-21 DIAGNOSIS — K810 Acute cholecystitis: Secondary | ICD-10-CM

## 2018-03-21 DIAGNOSIS — F1410 Cocaine abuse, uncomplicated: Secondary | ICD-10-CM

## 2018-03-21 DIAGNOSIS — R933 Abnormal findings on diagnostic imaging of other parts of digestive tract: Secondary | ICD-10-CM

## 2018-03-21 DIAGNOSIS — R768 Other specified abnormal immunological findings in serum: Secondary | ICD-10-CM

## 2018-03-21 LAB — CBC WITH DIFFERENTIAL/PLATELET
Basophils Absolute: 0 10*3/uL (ref 0.0–0.1)
Basophils Relative: 0 %
Eosinophils Absolute: 0.3 10*3/uL (ref 0.0–0.7)
Eosinophils Relative: 3 %
HCT: 40 % (ref 39.0–52.0)
Hemoglobin: 13.8 g/dL (ref 13.0–17.0)
LYMPHS ABS: 7 10*3/uL — AB (ref 0.7–4.0)
Lymphocytes Relative: 72 %
MCH: 29.3 pg (ref 26.0–34.0)
MCHC: 34.5 g/dL (ref 30.0–36.0)
MCV: 84.9 fL (ref 78.0–100.0)
MONO ABS: 0.4 10*3/uL (ref 0.1–1.0)
Monocytes Relative: 4 %
NEUTROS PCT: 21 %
Neutro Abs: 2.1 10*3/uL (ref 1.7–7.7)
PLATELETS: 112 10*3/uL — AB (ref 150–400)
RBC: 4.71 MIL/uL (ref 4.22–5.81)
RDW: 14 % (ref 11.5–15.5)
WBC: 9.8 10*3/uL (ref 4.0–10.5)

## 2018-03-21 LAB — HEPATITIS PANEL, ACUTE
HEP A IGM: NEGATIVE
Hep B C IgM: NEGATIVE
Hepatitis B Surface Ag: NEGATIVE

## 2018-03-21 LAB — COMPREHENSIVE METABOLIC PANEL
ALT: 2051 U/L — AB (ref 17–63)
AST: 467 U/L — AB (ref 15–41)
Albumin: 2.7 g/dL — ABNORMAL LOW (ref 3.5–5.0)
Alkaline Phosphatase: 163 U/L — ABNORMAL HIGH (ref 38–126)
Anion gap: 11 (ref 5–15)
BUN: 18 mg/dL (ref 6–20)
CALCIUM: 8.4 mg/dL — AB (ref 8.9–10.3)
CHLORIDE: 103 mmol/L (ref 101–111)
CO2: 22 mmol/L (ref 22–32)
Creatinine, Ser: 1.11 mg/dL (ref 0.61–1.24)
Glucose, Bld: 99 mg/dL (ref 65–99)
POTASSIUM: 3.6 mmol/L (ref 3.5–5.1)
SODIUM: 136 mmol/L (ref 135–145)
TOTAL PROTEIN: 5.4 g/dL — AB (ref 6.5–8.1)
Total Bilirubin: 6.5 mg/dL — ABNORMAL HIGH (ref 0.3–1.2)

## 2018-03-21 LAB — PATHOLOGIST SMEAR REVIEW

## 2018-03-21 LAB — CERULOPLASMIN: CERULOPLASMIN: 25.9 mg/dL (ref 16.0–31.0)

## 2018-03-21 LAB — IGG: IGG (IMMUNOGLOBIN G), SERUM: 926 mg/dL (ref 700–1600)

## 2018-03-21 LAB — MAGNESIUM: Magnesium: 2 mg/dL (ref 1.7–2.4)

## 2018-03-21 NOTE — Progress Notes (Signed)
[name], male   DOB: Aug 23, 1986, 32 y.o.   MRN: [id number]  PROGRESS NOTE    Jeffrey Wise  WUJ:[id number] DOB: 1986-10-03 DOA: 03/19/2018 PCP: Patient, No Pcp Per   Brief Narrative:  32 year old male with no past medical history presented with nausea, vomiting and abdominal pain.  He was found to have elevated LFTs and CT of the abdomen pelvis and ultrasound showed cholelithiasis with gallbladder thickening.  ED provider consulted GI.  Patient was started empirically on IV Zosyn.  General surgery was also consulted.  Assessment & Plan:   Active Problems:   AKI (acute kidney injury) ([place])   Elevated liver enzymes   Elevated LFTs    Mixed hepatocellular and cholestatic pattern of liver injury:  -No evidence of acute liver failure and encephalopathy -GI evaluation appreciated. -No signs of cholangitis.   -Empirically on Zosyn for concern for cholecystitis  - Pending ceruloplasmin, ANA, acute hepatitis panel, herpes simplex virus PCR, anti-smooth muscle antibody, anti-microsomal/kidney antibody, IgG levels, CMV PCR -Tolerating diet.  Cholelithiasis with probable cholecystitis -Continue Zosyn.  Follow cultures.  General surgery following, will follow their recommendations regarding timing of surgery.  Leukocytosis -Probably secondary to above.  Continue antibiotics.  Resolved. Polysubstance abuse -Urine drug screen positive for cocaine and amphetamines.  Patient denies any active use.  Counseled about this side effects of the same and encouraged cessation.  AKI:  -Probably secondary to poor oral intake and dehydration.  Improving.  Decrease normal saline to 75 cc an hour.  Repeat a.m. Labs  Thrombocytopenia:  -Unclear etiology.  No signs of bleeding.  Repeat a.m. labs   DVT prophylaxis: Heparin  code Status: Full Family Communication: None at bedside Disposition Plan: Depends on clinical outcome  Consultants: GI/general surgery  Procedures:  None  Antimicrobials: Zosyn from 03/19/2018 onwards   Subjective: Patient seen and examined at bedside.  No overnight fever, nausea, vomiting.  Tolerating diet.  Still complains of mild intermittent nausea and mild abdominal pain.  Objective: Vitals:   03/20/18 1547 03/20/18 2016 03/20/18 2320 03/21/18 0436  BP: 114/71 114/64 121/70 119/60  Pulse: 75 62 66 66  Resp: 18 19 18 19   Temp: 97.7 F (36.5 C) 98.2 F (36.8 C) 98.1 F (36.7 C) 97.8 F (36.6 C)  TempSrc: Oral Oral Oral Oral  SpO2: 99% 100% 99% 96%  Weight:      Height:        Intake/Output Summary (Last 24 hours) at 03/21/2018 0825 Last data filed at 03/20/2018 1500 Gross per 24 hour  Intake 1475 ml  Output 800 ml  Net 675 ml   Filed Weights   03/19/18 1740 03/20/18 0100  Weight: 63.5 kg (140 lb) 81.9 kg (180 lb 8.9 oz)    Examination:  General exam: Appears calm and comfortable.  No distress. Eyes: Icterus present, no pallor Respiratory system: Bilateral decreased breath sound at bases, no wheezing or crackles Cardiovascular system: Rate controlled, S1-S2 positive gastrointestinal system: Abdomen is nondistended, soft and mildly tender in the right upper quadrant, no Murphy sign present. Normal bowel sounds heard. Extremities: No cyanosis, edema   Data Reviewed: I have personally reviewed following labs and imaging studies  CBC: Recent Labs  Lab 03/19/18 1636 03/20/18 0142 03/21/18 0549  WBC 11.9* 15.8* 9.8  NEUTROABS  --   --  2.1  HGB 18.1* 16.7 13.8  HCT 50.0 46.5 40.0  MCV 84.2 82.0 84.9  PLT 120* 113*  113* 956*   Basic Metabolic Panel: Recent  Labs  Lab 03/19/18 1636 03/20/18 0142 03/21/18 0549  NA 134* 133* 136  K 3.6 3.5 3.6  CL 96* 100* 103  CO2 24 20* 22  GLUCOSE 157* 99 99  BUN 31* 28* 18  CREATININE 1.79* 1.36* 1.11  CALCIUM 9.1 8.5* 8.4*  MG  --   --  2.0   GFR: Estimated Creatinine Clearance: 94.7 mL/min (by C-G formula based on SCr of 1.11 mg/dL). Liver Function  Tests: Recent Labs  Lab 03/19/18 1636 03/20/18 0142 03/21/18 0549  AST 2,421* 1,431* 467*  ALT 5,232* 3,914* 2,051*  ALKPHOS 190* 164* 163*  BILITOT 11.3* 10.6* 6.5*  PROT 6.8 6.2* 5.4*  ALBUMIN 3.5 3.1* 2.7*   Recent Labs  Lab 03/19/18 1636  LIPASE 38   Recent Labs  Lab 03/19/18 1659  AMMONIA 28   Coagulation Profile: Recent Labs  Lab 03/19/18 2131 03/20/18 0142  INR 1.25 1.25   Cardiac Enzymes: Recent Labs  Lab 03/19/18 2131  CKTOTAL 30*   BNP (last 3 results) No results for input(s): PROBNP in the last 8760 hours. HbA1C: No results for input(s): HGBA1C in the last 72 hours. CBG: No results for input(s): GLUCAP in the last 168 hours. Lipid Profile: No results for input(s): CHOL, HDL, LDLCALC, TRIG, CHOLHDL, LDLDIRECT in the last 72 hours. Thyroid Function Tests: No results for input(s): TSH, T4TOTAL, FREET4, T3FREE, THYROIDAB in the last 72 hours. Anemia Panel: No results for input(s): VITAMINB12, FOLATE, FERRITIN, TIBC, IRON, RETICCTPCT in the last 72 hours. Sepsis Labs: Recent Labs  Lab 03/19/18 1643 03/19/18 1913  LATICACIDVEN 3.69* 2.39*    Recent Results (from the past 240 hour(s))  Blood Culture (routine x 2)     Status: None (Preliminary result)   Collection Time: 03/19/18  5:02 PM  Result Value Ref Range Status   Specimen Description   Final    BLOOD LEFT HAND Performed at [facility] [address], [place], [place] [address]    Special Requests   Final    BOTTLES DRAWN AEROBIC AND ANAEROBIC Blood Culture adequate volume Performed at [facility] [address], [place], [place] [address]    Culture   Final    NO GROWTH < 24 HOURS Performed at [facility] [address] [place], [place] [address]    Report Status PENDING  Incomplete  Blood Culture (routine x 2)     Status: None (Preliminary result)   Collection Time: 03/19/18  5:07 PM  Result Value Ref Range Status   Specimen  Description   Final    BLOOD LEFT ARM Performed at [facility] [address] [place], [place] [address]    Special Requests   Final    BOTTLES DRAWN AEROBIC AND ANAEROBIC Blood Culture adequate volume Performed at [facility] [address], [place], [place] [address]    Culture   Final    NO GROWTH < 24 HOURS Performed at [facility] [address] [place], [place] [address]    Report Status PENDING  Incomplete         Radiology Studies: Ct Abdomen Pelvis W Contrast  Result Date: 03/19/2018 CLINICAL DATA:  Abdominal pain with nausea and vomiting 7 days. EXAM: CT ABDOMEN AND PELVIS WITH CONTRAST TECHNIQUE: Multidetector CT imaging of the abdomen and pelvis was performed using the standard protocol following bolus administration of intravenous contrast. CONTRAST:  135mL ISOVUE-300 IOPAMIDOL (ISOVUE-300) INJECTION 61% COMPARISON:  12/21/2010 FINDINGS:  Lower chest: Lung bases are normal. Couple adjacent right pericardiophrenic lymph nodes with the larger measuring 9 mm by short axis. Hepatobiliary: Liver and biliary tree are normal. Significant gallbladder wall thickening and possible small amount of pericholecystic fluid. No definite sludge or cholelithiasis. Pancreas: Normal. Spleen: Mild splenomegaly. Adrenals/Urinary Tract: Adrenal glands are normal. 2 mm stone over the upper pole collecting system of the left kidney. No right renal stones. No hydronephrosis. Ureters and bladder are normal. Stomach/Bowel: Stomach and small bowel are within normal. Appendix is normal. Colon is unremarkable. Vascular/Lymphatic: Abdominal aorta is normal. Remaining vascular structures are unremarkable. No adenopathy. Reproductive: Small amount of free fluid in the pelvis. Normal prostate. Other: No acute inflammatory change. Musculoskeletal: Unremarkable. IMPRESSION: Significant gallbladder wall thickening and possible small amount of pericholecystic fluid. No  significant adjacent inflammatory change and no definite stones or sludge. Recommend right upper quadrant ultrasound for further evaluation. 2 mm nonobstructing left renal stone. Small amount of nonspecific free fluid in the pelvis. Electronically Signed   By: Marin Olp M.D.   On: 03/19/2018 19:15   US Liver Doppler  Result Date: 03/19/2018 CLINICAL DATA:  Nausea and vomiting EXAM: DUPLEX ULTRASOUND OF LIVER ULTRASOUND ABDOMEN LIMITED RIGHT UPPER QUADRANT; TECHNIQUE: Color and duplex Doppler ultrasound was performed to evaluate the hepatic in-flow and out-flow vessels. COMPARISON:  None. FINDINGS: Gallbladder ultrasound: Gallbladder: Gallbladder wall edema with single wall thickness of the gallbladder of 9.1 mm. Several gallstones are also present, the largest measuring 2.2 cm. No sonographic Murphy's sign however. No pericholecystic fluid. Common bile duct: Diameter: 3.4 mm and normal Liver: No focal lesion identified. Within normal limits in parenchymal echogenicity. Portal vein is patent on color Doppler imaging with normal direction of blood flow towards the liver. Liver Doppler ultrasound : Portal vein size: 1.2 cm Portal venous velocities: Proximal portal vein 24.2 cm/second and hepatopetal, mid portal vein velocity 64.7 cm and hepatopetal, and distal portal vein velocity 100 cm/second, also hepatopetal. Right portal vein velocity: 23.3 cm/second and hepatopetal. Left portal vein velocity 47.5 cm and hepatopetal Hepatic Vein Velocities Right:  23.5  cm/sec and hepatofugal Middle:  35.1  cm/sec and hepatofugal Left: 25.5  cm/sec and hepatofugal Hepatic Artery Velocity:  99.8  cm/sec Splenic Vein Velocity:  33.1 cm/sec Varices: Absent Ascites: Absent No portal or splenic vein occlusion or thrombus. IMPRESSION: 1. Cholelithiasis with gallbladder wall thickening concerning for changes of cholecystitis, more likely chronic given lack of sonographic Murphy sign and other ancillary findings such as gallbladder  distention and pericholecystic fluid. 2. Normal hepatopetal pedal flow towards the liver within the portal venous system and hepatofugal flow away from the liver within the hepatic venous system. 3. Absent varices, ascites, portal/splenic vein occlusion and thrombus. Electronically Signed   By: Ashley Royalty M.D.   On: 03/19/2018 23:25   US Abdomen Limited Ruq  Result Date: 03/19/2018 CLINICAL DATA:  Nausea and vomiting EXAM: DUPLEX ULTRASOUND OF LIVER ULTRASOUND ABDOMEN LIMITED RIGHT UPPER QUADRANT; TECHNIQUE: Color and duplex Doppler ultrasound was performed to evaluate the hepatic in-flow and out-flow vessels. COMPARISON:  None. FINDINGS: Gallbladder ultrasound: Gallbladder: Gallbladder wall edema with single wall thickness of the gallbladder of 9.1 mm. Several gallstones are also present, the largest measuring 2.2 cm. No sonographic Murphy's sign however. No pericholecystic fluid. Common bile duct: Diameter: 3.4 mm and normal Liver: No focal lesion identified. Within normal limits in parenchymal echogenicity. Portal vein is patent on color Doppler imaging with normal direction of blood flow towards the liver. Liver Doppler  ultrasound : Portal vein size: 1.2 cm Portal venous velocities: Proximal portal vein 24.2 cm/second and hepatopetal, mid portal vein velocity 64.7 cm and hepatopetal, and distal portal vein velocity 100 cm/second, also hepatopetal. Right portal vein velocity: 23.3 cm/second and hepatopetal. Left portal vein velocity 47.5 cm and hepatopetal Hepatic Vein Velocities Right:  23.5  cm/sec and hepatofugal Middle:  35.1  cm/sec and hepatofugal Left: 25.5  cm/sec and hepatofugal Hepatic Artery Velocity:  99.8  cm/sec Splenic Vein Velocity:  33.1 cm/sec Varices: Absent Ascites: Absent No portal or splenic vein occlusion or thrombus. IMPRESSION: 1. Cholelithiasis with gallbladder wall thickening concerning for changes of cholecystitis, more likely chronic given lack of sonographic Murphy sign and other  ancillary findings such as gallbladder distention and pericholecystic fluid. 2. Normal hepatopetal pedal flow towards the liver within the portal venous system and hepatofugal flow away from the liver within the hepatic venous system. 3. Absent varices, ascites, portal/splenic vein occlusion and thrombus. Electronically Signed   By: Ashley Royalty M.D.   On: 03/19/2018 23:25        Scheduled Meds: . heparin  5,000 Units Subcutaneous Q8H   Continuous Infusions: . sodium chloride 125 mL/hr at 03/21/18 0514  . piperacillin-tazobactam (ZOSYN)  IV 3.375 g (03/21/18 0541)     LOS: 2 days        Aline August, MD [facility] Pager [phone]  If 7PM-7AM, please contact night-coverage [email or web address] Password [email or web address] 03/21/2018, 8:25 AM

## 2018-03-21 NOTE — Progress Notes (Addendum)
[facility] Progress Note   Chief Complaint:   Abnormal liver chemistries   SUBJECTIVE:    awoke him from sleep. He hasn't had any further N/V nor abdominal pain   ASSESSMENT AND PLAN:    1. Acute liver injury, mixed pattern. Possibly secondary to cocaine / amphetamine use but ?aAcute HCV, HCV ab is positive.  -will check HCV RNA -Liver chemistries continue to improve. No mental status changes. Hopefully home soon -he has a CMET ordered for tomorrow. Will add INR since one not done today  2. AKI, resolved  3. Upper abdominal pain / vomiting. Sx may have bee related to #1 and / or gallbladder disease. Imaging + for cholelithiasis / ? Cholecystitis..  -On Zosyn.  -Surgery following. WBC has normalized   4. Acute thrombocytopenia, stable count of 112.    OBJECTIVE:     Vital signs in last 24 hours: Temp:  [97.7 F (36.5 C)-98.2 F (36.8 C)] 97.8 F (36.6 C) (05/10 0436) Pulse Rate:  [62-75] 66 (05/10 0436) Resp:  [16-20] 19 (05/10 0436) BP: (114-154)/(60-82) 119/60 (05/10 0436) SpO2:  [94 %-100 %] 96 % (05/10 0436) Last BM Date: 03/10/18 General:   Alert, well-developed, white male in NAD EENT:  Normal hearing  Heart:  Regular rate and rhythm; no murmurs. No lower extremity edema Pulm: Normal respiratory effort Abdomen:  Soft, nondistended, nontender.  Normal bowel sounds, no masses felt. No hepatomegaly.    Neurologic:  Alert and  oriented x4;  grossly normal neurologically. Psych:  Pleasant, cooperative.  Normal mood and affect.   Intake/Output from previous day: 05/09 0701 - 05/10 0700 In: 1475 [I.V.:1375; IV Piggyback:100] Out: 800 [Urine:800] Intake/Output this shift: Total I/O In: 120 [P.O.:120] Out: -   Lab Results: Recent Labs    03/19/18 1636 03/20/18 0142 03/21/18 0549  WBC 11.9* 15.8* 9.8  HGB 18.1* 16.7 13.8  HCT 50.0 46.5 40.0  PLT 120* 113*  113* 112*   BMET Recent Labs    03/19/18 1636 03/20/18 0142  03/21/18 0549  NA 134* 133* 136  K 3.6 3.5 3.6  CL 96* 100* 103  CO2 24 20* 22  GLUCOSE 157* 99 99  BUN 31* 28* 18  CREATININE 1.79* 1.36* 1.11  CALCIUM 9.1 8.5* 8.4*   LFT Recent Labs    03/20/18 0142 03/21/18 0549  PROT 6.2* 5.4*  ALBUMIN 3.1* 2.7*  AST 1,431* 467*  ALT 3,914* 2,051*  ALKPHOS 164* 163*  BILITOT 10.6* 6.5*  BILIDIR 6.5*  --   IBILI 4.1*  --    PT/INR Recent Labs    03/19/18 2131 03/20/18 0142  LABPROT 15.5* 15.6*  INR 1.25 1.25   Hepatitis Panel Recent Labs    03/19/18 2131  HEPBSAG Negative  HCVAB >11.0*  HEPAIGM Negative  HEPBIGM Negative    Ct Abdomen Pelvis W Contrast  Result Date: 03/19/2018 CLINICAL DATA:  Abdominal pain with nausea and vomiting 7 days. EXAM: CT ABDOMEN AND PELVIS WITH CONTRAST TECHNIQUE: Multidetector CT imaging of the abdomen and pelvis was performed using the standard protocol following bolus administration of intravenous contrast. CONTRAST:  137mL ISOVUE-300 IOPAMIDOL (ISOVUE-300) INJECTION 61% COMPARISON:  12/21/2010 FINDINGS: Lower chest: Lung bases are normal. Couple adjacent right pericardiophrenic lymph nodes with the larger measuring 9 mm by short axis. Hepatobiliary: Liver and biliary tree are normal. Significant gallbladder wall thickening and possible small amount of pericholecystic fluid. No definite sludge or cholelithiasis. Pancreas: Normal. Spleen: Mild splenomegaly. Adrenals/Urinary Tract: Adrenal glands are normal.  2 mm stone over the upper pole collecting system of the left kidney. No right renal stones. No hydronephrosis. Ureters and bladder are normal. Stomach/Bowel: Stomach and small bowel are within normal. Appendix is normal. Colon is unremarkable. Vascular/Lymphatic: Abdominal aorta is normal. Remaining vascular structures are unremarkable. No adenopathy. Reproductive: Small amount of free fluid in the pelvis. Normal prostate. Other: No acute inflammatory change. Musculoskeletal: Unremarkable. IMPRESSION:  Significant gallbladder wall thickening and possible small amount of pericholecystic fluid. No significant adjacent inflammatory change and no definite stones or sludge. Recommend right upper quadrant ultrasound for further evaluation. 2 mm nonobstructing left renal stone. Small amount of nonspecific free fluid in the pelvis. Electronically Signed   By: Marin Olp M.D.   On: 03/19/2018 19:15   US Liver Doppler  Result Date: 03/19/2018 CLINICAL DATA:  Nausea and vomiting EXAM: DUPLEX ULTRASOUND OF LIVER ULTRASOUND ABDOMEN LIMITED RIGHT UPPER QUADRANT; TECHNIQUE: Color and duplex Doppler ultrasound was performed to evaluate the hepatic in-flow and out-flow vessels. COMPARISON:  None. FINDINGS: Gallbladder ultrasound: Gallbladder: Gallbladder wall edema with single wall thickness of the gallbladder of 9.1 mm. Several gallstones are also present, the largest measuring 2.2 cm. No sonographic Murphy's sign however. No pericholecystic fluid. Common bile duct: Diameter: 3.4 mm and normal Liver: No focal lesion identified. Within normal limits in parenchymal echogenicity. Portal vein is patent on color Doppler imaging with normal direction of blood flow towards the liver. Liver Doppler ultrasound : Portal vein size: 1.2 cm Portal venous velocities: Proximal portal vein 24.2 cm/second and hepatopetal, mid portal vein velocity 64.7 cm and hepatopetal, and distal portal vein velocity 100 cm/second, also hepatopetal. Right portal vein velocity: 23.3 cm/second and hepatopetal. Left portal vein velocity 47.5 cm and hepatopetal Hepatic Vein Velocities Right:  23.5  cm/sec and hepatofugal Middle:  35.1  cm/sec and hepatofugal Left: 25.5  cm/sec and hepatofugal Hepatic Artery Velocity:  99.8  cm/sec Splenic Vein Velocity:  33.1 cm/sec Varices: Absent Ascites: Absent No portal or splenic vein occlusion or thrombus. IMPRESSION: 1. Cholelithiasis with gallbladder wall thickening concerning for changes of cholecystitis, more likely  chronic given lack of sonographic Murphy sign and other ancillary findings such as gallbladder distention and pericholecystic fluid. 2. Normal hepatopetal pedal flow towards the liver within the portal venous system and hepatofugal flow away from the liver within the hepatic venous system. 3. Absent varices, ascites, portal/splenic vein occlusion and thrombus. Electronically Signed   By: Ashley Royalty M.D.   On: 03/19/2018 23:25   US Abdomen Limited Ruq  Result Date: 03/19/2018 CLINICAL DATA:  Nausea and vomiting EXAM: DUPLEX ULTRASOUND OF LIVER ULTRASOUND ABDOMEN LIMITED RIGHT UPPER QUADRANT; TECHNIQUE: Color and duplex Doppler ultrasound was performed to evaluate the hepatic in-flow and out-flow vessels. COMPARISON:  None. FINDINGS: Gallbladder ultrasound: Gallbladder: Gallbladder wall edema with single wall thickness of the gallbladder of 9.1 mm. Several gallstones are also present, the largest measuring 2.2 cm. No sonographic Murphy's sign however. No pericholecystic fluid. Common bile duct: Diameter: 3.4 mm and normal Liver: No focal lesion identified. Within normal limits in parenchymal echogenicity. Portal vein is patent on color Doppler imaging with normal direction of blood flow towards the liver. Liver Doppler ultrasound : Portal vein size: 1.2 cm Portal venous velocities: Proximal portal vein 24.2 cm/second and hepatopetal, mid portal vein velocity 64.7 cm and hepatopetal, and distal portal vein velocity 100 cm/second, also hepatopetal. Right portal vein velocity: 23.3 cm/second and hepatopetal. Left portal vein velocity 47.5 cm and hepatopetal Hepatic Vein Velocities Right:  23.5  cm/sec and hepatofugal Middle:  35.1  cm/sec and hepatofugal Left: 25.5  cm/sec and hepatofugal Hepatic Artery Velocity:  99.8  cm/sec Splenic Vein Velocity:  33.1 cm/sec Varices: Absent Ascites: Absent No portal or splenic vein occlusion or thrombus. IMPRESSION: 1. Cholelithiasis with gallbladder wall thickening concerning  for changes of cholecystitis, more likely chronic given lack of sonographic Murphy sign and other ancillary findings such as gallbladder distention and pericholecystic fluid. 2. Normal hepatopetal pedal flow towards the liver within the portal venous system and hepatofugal flow away from the liver within the hepatic venous system. 3. Absent varices, ascites, portal/splenic vein occlusion and thrombus. Electronically Signed   By: Ashley Royalty M.D.   On: 03/19/2018 23:25     LOS: 2 days   Tye Savoy ,NP 03/21/2018, 9:19 AM    Attending physician's note   I have taken an interval history, reviewed the chart and examined the patient. I agree with the Advanced Practitioner's note, impression and recommendations.  Billirubin and transaminases trending down Hep C Ab positive, viral load pending  Acute hepatitis likely secondary to cocaine and also possible Hep C Refer to ID clinic on discharge if Hep C viral load is detectable   F/u INR  Surgery following patient for ?cholecystitis   K. Denzil Magnuson , MD [id number]

## 2018-03-21 NOTE — Progress Notes (Signed)
[facility] Progress Note     Subjective: CC: acute hepatitis Patient with some improvement in labs, HCV ab +. Patient denies past IV drug abuse. Patient states mother noted yellowing of skin and eyes a few days ago. Abdominal pain improving some. Tolerating liquids.   Objective: Vital signs in last 24 hours: Temp:  [97.7 F (36.5 C)-98.2 F (36.8 C)] 97.8 F (36.6 C) (05/10 0436) Pulse Rate:  [62-75] 66 (05/10 0436) Resp:  [16-20] 19 (05/10 0436) BP: (114-154)/(60-82) 119/60 (05/10 0436) SpO2:  [94 %-100 %] 96 % (05/10 0436) Last BM Date: 03/10/18  Intake/Output from previous day: 05/09 0701 - 05/10 0700 In: 1475 [I.V.:1375; IV Piggyback:100] Out: 800 [Urine:800] Intake/Output this shift: Total I/O In: 120 [P.O.:120] Out: -   PE: Gen:  Alert, NAD, pleasant HEENT: sclera icteric Card:  Regular rate and rhythm, pedal pulses 2+ BL Pulm:  Normal effort, clear to auscultation bilaterally Abd: Soft, mildly tender in RLQ, non-distended, bowel sounds present  Skin: warm and dry, no rashes; jaundiced Psych: A&Ox3, flat affect  Lab Results:  Recent Labs    03/20/18 0142 03/21/18 0549  WBC 15.8* 9.8  HGB 16.7 13.8  HCT 46.5 40.0  PLT 113*  113* 112*   BMET Recent Labs    03/20/18 0142 03/21/18 0549  NA 133* 136  K 3.5 3.6  CL 100* 103  CO2 20* 22  GLUCOSE 99 99  BUN 28* 18  CREATININE 1.36* 1.11  CALCIUM 8.5* 8.4*   PT/INR Recent Labs    03/19/18 2131 03/20/18 0142  LABPROT 15.5* 15.6*  INR 1.25 1.25   CMP     Component Value Date/Time   NA 136 03/21/2018 0549   K 3.6 03/21/2018 0549   CL 103 03/21/2018 0549   CO2 22 03/21/2018 0549   GLUCOSE 99 03/21/2018 0549   BUN 18 03/21/2018 0549   CREATININE 1.11 03/21/2018 0549   CALCIUM 8.4 (L) 03/21/2018 0549   PROT 5.4 (L) 03/21/2018 0549   ALBUMIN 2.7 (L) 03/21/2018 0549   AST 467 (H) 03/21/2018 0549   ALT 2,051 (H) 03/21/2018 0549   ALKPHOS 163 (H) 03/21/2018 0549   BILITOT 6.5 (H)  03/21/2018 0549   GFRNONAA >60 03/21/2018 0549   GFRAA >60 03/21/2018 0549   Lipase     Component Value Date/Time   LIPASE 38 03/19/2018 1636       Studies/Results: Ct Abdomen Pelvis W Contrast  Result Date: 03/19/2018 CLINICAL DATA:  Abdominal pain with nausea and vomiting 7 days. EXAM: CT ABDOMEN AND PELVIS WITH CONTRAST TECHNIQUE: Multidetector CT imaging of the abdomen and pelvis was performed using the standard protocol following bolus administration of intravenous contrast. CONTRAST:  145mL ISOVUE-300 IOPAMIDOL (ISOVUE-300) INJECTION 61% COMPARISON:  12/21/2010 FINDINGS: Lower chest: Lung bases are normal. Couple adjacent right pericardiophrenic lymph nodes with the larger measuring 9 mm by short axis. Hepatobiliary: Liver and biliary tree are normal. Significant gallbladder wall thickening and possible small amount of pericholecystic fluid. No definite sludge or cholelithiasis. Pancreas: Normal. Spleen: Mild splenomegaly. Adrenals/Urinary Tract: Adrenal glands are normal. 2 mm stone over the upper pole collecting system of the left kidney. No right renal stones. No hydronephrosis. Ureters and bladder are normal. Stomach/Bowel: Stomach and small bowel are within normal. Appendix is normal. Colon is unremarkable. Vascular/Lymphatic: Abdominal aorta is normal. Remaining vascular structures are unremarkable. No adenopathy. Reproductive: Small amount of free fluid in the pelvis. Normal prostate. Other: No acute inflammatory change. Musculoskeletal: Unremarkable. IMPRESSION: Significant gallbladder wall  thickening and possible small amount of pericholecystic fluid. No significant adjacent inflammatory change and no definite stones or sludge. Recommend right upper quadrant ultrasound for further evaluation. 2 mm nonobstructing left renal stone. Small amount of nonspecific free fluid in the pelvis. Electronically Signed   By: Marin Olp M.D.   On: 03/19/2018 19:15   US Liver Doppler  Result  Date: 03/19/2018 CLINICAL DATA:  Nausea and vomiting EXAM: DUPLEX ULTRASOUND OF LIVER ULTRASOUND ABDOMEN LIMITED RIGHT UPPER QUADRANT; TECHNIQUE: Color and duplex Doppler ultrasound was performed to evaluate the hepatic in-flow and out-flow vessels. COMPARISON:  None. FINDINGS: Gallbladder ultrasound: Gallbladder: Gallbladder wall edema with single wall thickness of the gallbladder of 9.1 mm. Several gallstones are also present, the largest measuring 2.2 cm. No sonographic Murphy's sign however. No pericholecystic fluid. Common bile duct: Diameter: 3.4 mm and normal Liver: No focal lesion identified. Within normal limits in parenchymal echogenicity. Portal vein is patent on color Doppler imaging with normal direction of blood flow towards the liver. Liver Doppler ultrasound : Portal vein size: 1.2 cm Portal venous velocities: Proximal portal vein 24.2 cm/second and hepatopetal, mid portal vein velocity 64.7 cm and hepatopetal, and distal portal vein velocity 100 cm/second, also hepatopetal. Right portal vein velocity: 23.3 cm/second and hepatopetal. Left portal vein velocity 47.5 cm and hepatopetal Hepatic Vein Velocities Right:  23.5  cm/sec and hepatofugal Middle:  35.1  cm/sec and hepatofugal Left: 25.5  cm/sec and hepatofugal Hepatic Artery Velocity:  99.8  cm/sec Splenic Vein Velocity:  33.1 cm/sec Varices: Absent Ascites: Absent No portal or splenic vein occlusion or thrombus. IMPRESSION: 1. Cholelithiasis with gallbladder wall thickening concerning for changes of cholecystitis, more likely chronic given lack of sonographic Murphy sign and other ancillary findings such as gallbladder distention and pericholecystic fluid. 2. Normal hepatopetal pedal flow towards the liver within the portal venous system and hepatofugal flow away from the liver within the hepatic venous system. 3. Absent varices, ascites, portal/splenic vein occlusion and thrombus. Electronically Signed   By: Ashley Royalty M.D.   On: 03/19/2018  23:25   US Abdomen Limited Ruq  Result Date: 03/19/2018 CLINICAL DATA:  Nausea and vomiting EXAM: DUPLEX ULTRASOUND OF LIVER ULTRASOUND ABDOMEN LIMITED RIGHT UPPER QUADRANT; TECHNIQUE: Color and duplex Doppler ultrasound was performed to evaluate the hepatic in-flow and out-flow vessels. COMPARISON:  None. FINDINGS: Gallbladder ultrasound: Gallbladder: Gallbladder wall edema with single wall thickness of the gallbladder of 9.1 mm. Several gallstones are also present, the largest measuring 2.2 cm. No sonographic Murphy's sign however. No pericholecystic fluid. Common bile duct: Diameter: 3.4 mm and normal Liver: No focal lesion identified. Within normal limits in parenchymal echogenicity. Portal vein is patent on color Doppler imaging with normal direction of blood flow towards the liver. Liver Doppler ultrasound : Portal vein size: 1.2 cm Portal venous velocities: Proximal portal vein 24.2 cm/second and hepatopetal, mid portal vein velocity 64.7 cm and hepatopetal, and distal portal vein velocity 100 cm/second, also hepatopetal. Right portal vein velocity: 23.3 cm/second and hepatopetal. Left portal vein velocity 47.5 cm and hepatopetal Hepatic Vein Velocities Right:  23.5  cm/sec and hepatofugal Middle:  35.1  cm/sec and hepatofugal Left: 25.5  cm/sec and hepatofugal Hepatic Artery Velocity:  99.8  cm/sec Splenic Vein Velocity:  33.1 cm/sec Varices: Absent Ascites: Absent No portal or splenic vein occlusion or thrombus. IMPRESSION: 1. Cholelithiasis with gallbladder wall thickening concerning for changes of cholecystitis, more likely chronic given lack of sonographic Murphy sign and other ancillary findings such as gallbladder distention  and pericholecystic fluid. 2. Normal hepatopetal pedal flow towards the liver within the portal venous system and hepatofugal flow away from the liver within the hepatic venous system. 3. Absent varices, ascites, portal/splenic vein occlusion and thrombus. Electronically Signed    By: Ashley Royalty M.D.   On: 03/19/2018 23:25    Anti-infectives: Anti-infectives (From admission, onward)   Start     Dose/Rate Route Frequency Ordered Stop   03/20/18 0130  piperacillin-tazobactam (ZOSYN) IVPB 3.375 g     3.375 g 12.5 mL/hr over 240 Minutes Intravenous Every 8 hours 03/20/18 0112     03/19/18 1715  piperacillin-tazobactam (ZOSYN) IVPB 3.375 g     3.375 g 100 mL/hr over 30 Minutes Intravenous  Once 03/19/18 1702 03/19/18 1803       Assessment/Plan AKI - Cr 1.11, trending down Thrombocytopenia - plts 112, per primary service PSA  Transaminitis - AST 467, ALT 2,051, ALP 163, Tbili 6.5; trending down  - hepatitis panel positive for HCV antibody - HCV RNA pending - GI consulting  Cholelithiasis with likely chronic cholecystitis - RUQ [place]: cholelithiasis with gallbladder wall thickening  - CT: gallbladder wall thickening, ?small amount pericholecystic fluid - WBC: 9.8, afebrile - continue IV zosyn - patient only mildly TTP - we will follow for readiness  FEN: regular diet VTE: SCDs ID: Zosyn 5/9>>    LOS: 2 days    Brigid Re , [facility] 03/21/2018, 9:19 AM Pager: [phone] Consults: [phone] Mon-Fri 7:00 am-4:30 pm Sat-Sun 7:00 am-11:30 am

## 2018-03-22 DIAGNOSIS — F1410 Cocaine abuse, uncomplicated: Secondary | ICD-10-CM

## 2018-03-22 DIAGNOSIS — R768 Other specified abnormal immunological findings in serum: Secondary | ICD-10-CM

## 2018-03-22 LAB — CBC WITH DIFFERENTIAL/PLATELET
BASOS ABS: 0 10*3/uL (ref 0.0–0.1)
Basophils Relative: 0 %
EOS ABS: 0.2 10*3/uL (ref 0.0–0.7)
Eosinophils Relative: 4 %
HCT: 37.5 % — ABNORMAL LOW (ref 39.0–52.0)
HEMOGLOBIN: 12.7 g/dL — AB (ref 13.0–17.0)
LYMPHS PCT: 55 %
Lymphs Abs: 3.4 10*3/uL (ref 0.7–4.0)
MCH: 28.9 pg (ref 26.0–34.0)
MCHC: 33.9 g/dL (ref 30.0–36.0)
MCV: 85.4 fL (ref 78.0–100.0)
Monocytes Absolute: 0.4 10*3/uL (ref 0.1–1.0)
Monocytes Relative: 6 %
NEUTROS PCT: 35 %
Neutro Abs: 2.1 10*3/uL (ref 1.7–7.7)
Platelets: 132 10*3/uL — ABNORMAL LOW (ref 150–400)
RBC: 4.39 MIL/uL (ref 4.22–5.81)
RDW: 14.3 % (ref 11.5–15.5)
WBC: 6.1 10*3/uL (ref 4.0–10.5)

## 2018-03-22 LAB — COMPREHENSIVE METABOLIC PANEL
ALBUMIN: 2.6 g/dL — AB (ref 3.5–5.0)
ALK PHOS: 186 U/L — AB (ref 38–126)
ALT: 1346 U/L — AB (ref 17–63)
AST: 223 U/L — ABNORMAL HIGH (ref 15–41)
Anion gap: 8 (ref 5–15)
BUN: 19 mg/dL (ref 6–20)
CALCIUM: 8.5 mg/dL — AB (ref 8.9–10.3)
CHLORIDE: 107 mmol/L (ref 101–111)
CO2: 24 mmol/L (ref 22–32)
Creatinine, Ser: 1.05 mg/dL (ref 0.61–1.24)
GFR calc Af Amer: >60 mL/min (ref >60)
GFR calc non Af Amer: >60 mL/min (ref >60)
Glucose, Bld: 90 mg/dL (ref 65–99)
Potassium: 3.5 mmol/L (ref 3.5–5.1)
SODIUM: 139 mmol/L (ref 135–145)
Total Bilirubin: 3.8 mg/dL — ABNORMAL HIGH (ref 0.3–1.2)
Total Protein: 5.6 g/dL — ABNORMAL LOW (ref 6.5–8.1)

## 2018-03-22 LAB — HEPATITIS B SURFACE ANTIBODY,QUALITATIVE: HEP B S AB: NONREACTIVE

## 2018-03-22 LAB — CMV DNA, QUANTITATIVE, PCR
CMV DNA Quant: NEGATIVE IU/mL
Log10 CMV Qn DNA Pl: UNDETERMINED log10 IU/mL

## 2018-03-22 LAB — PROTIME-INR
INR: 0.99
Prothrombin Time: 13 seconds (ref 11.4–15.2)

## 2018-03-22 LAB — HCV RNA QUANT
HCV QUANT: 3710 [IU]/mL (ref >50)
HCV Quantitative Log: 3.569 log10 IU/mL (ref >1.70)

## 2018-03-22 LAB — HEPATITIS A ANTIBODY, TOTAL: Hep A Total Ab: POSITIVE — AB

## 2018-03-22 LAB — HEPATITIS B SURFACE ANTIGEN: HEP B S AG: NEGATIVE

## 2018-03-22 LAB — ANTI-SMOOTH MUSCLE ANTIBODY, IGG: F-Actin IgG: 9 Units (ref 0–19)

## 2018-03-22 LAB — MAGNESIUM: MAGNESIUM: 1.9 mg/dL (ref 1.7–2.4)

## 2018-03-22 LAB — ANTI-MICROSOMAL ANTIBODY LIVER / KIDNEY: LKM1 Ab: 0.7 U (ref 0.0–20.0)

## 2018-03-22 NOTE — Progress Notes (Signed)
UNASSIGNED PATIENT CROSS COVER [facility] Subjective: Patient seen and examined; chart reviewed. He seems to be doing very well this morning and denies having any nausea vomiting abdominal pain. His LFTs have improved significantly almost less than half of what they were yesterday his arm AST is down from 467-223 and ALTs down from 2051-1346 alkaline phosphatase is slightly elevated from 163-186 today. 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 That will be planned after he gets  treated for Hepatitis C.  Objective: Vital signs in last 24 hours: Temp:  [97.9 F (36.6 C)-98.6 F (37 C)] 97.9 F (36.6 C) (05/11 0627) Pulse Rate:  [67-71] 69 (05/11 0627) Resp:  [18-19] 18 (05/11 0627) BP: (119-131)/(63-71) 124/71 (05/11 0627) SpO2:  [97 %-100 %] 97 % (05/11 0627) Last BM Date: 03/10/18  Intake/Output from previous day: 05/10 0701 - 05/11 0700 In: 600 [P.O.:600] Out: -  Intake/Output this shift: No intake/output data recorded.  General appearance: alert, cooperative, jaundiced, appears stated age and no distress Resp: clear to auscultation bilaterally Cardio: regular rate and rhythm, S1, S2 normal, no murmur, click, rub or gallop GI: soft, non-tender; bowel sounds normal; no masses,  no organomegaly Extremities: extremities normal, atraumatic, no cyanosis or edema  Lab Results: Recent Labs    03/20/18 0142 03/21/18 0549 03/22/18 0532  WBC 15.8* 9.8 6.1  HGB 16.7 13.8 12.7*  HCT 46.5 40.0 37.5*  PLT 113*  113* 112* 132*   BMET Recent Labs    03/20/18 0142 03/21/18 0549 03/22/18 0532  NA 133* 136 139  K 3.5 3.6 3.5  CL 100* 103 107  CO2 20* 22 24  GLUCOSE 99 99 90  BUN 28* 18 19  CREATININE 1.36* 1.11 1.05  CALCIUM 8.5* 8.4* 8.5*   LFT Recent Labs    03/20/18 0142  03/22/18 0532  PROT 6.2*   < > 5.6*  ALBUMIN 3.1*   < > 2.6*  AST 1,431*   < > 223*  ALT 3,914*   < > 1,346*  ALKPHOS 164*   < > 186*  BILITOT 10.6*   < > 3.8*   BILIDIR 6.5*  --   --   IBILI 4.1*  --   --    < > = values in this interval not displayed.   PT/INR Recent Labs    03/20/18 0142 03/22/18 0532  LABPROT 15.6* 13.0  INR 1.25 0.99   Hepatitis Panel Recent Labs    03/19/18 2131 03/21/18 1149  HEPBSAG Negative Negative  HCVAB >11.0*  --   HEPAIGM Negative  --   HEPBIGM Negative  --    Medications: I have reviewed the patient's current medications.  Assessment/Plan: 1) Acute liver injury probably multifactorial from drug abuse, Hepatitis C and gallstones. Seems to be improving. HCV RNA pending.He can be followed up on an OP basis for this. 2) Acute kidney injury seems to have resolved.   LOS: 3 days   Beth Goodlin 03/22/2018, 9:19 AM

## 2018-03-22 NOTE — Progress Notes (Signed)
[name], male   DOB: 17-Aug-1986, 32 y.o.   MRN: [id number]  PROGRESS NOTE    Jeffrey Wise  BPZ:[id number] DOB: [birth year] DOA: 03/19/2018 PCP: Patient, No Pcp Per   Brief Narrative:  32 year old male with no past medical history presented with nausea, vomiting and abdominal pain.  He was found to have elevated LFTs and CT of the abdomen pelvis and ultrasound showed cholelithiasis with gallbladder thickening.  ED provider consulted GI.  Patient was started empirically on IV Zosyn.  General surgery was also consulted.  Assessment & Plan:   Active Problems:   AKI (acute kidney injury) ([place])   Elevated liver enzymes   Elevated LFTs   Hepatitis C antibody positive in blood   Cocaine abuse ([facility])    Mixed hepatocellular and cholestatic pattern of liver injury:  -No evidence of acute liver failure and encephalopathy -GI following -No signs of cholangitis.   -LFTs improving. -Hep C antibody positive.  Awaiting hep C RNA PCR -Tolerating diet.  Cholelithiasis with probable cholecystitis -Continue Zosyn.  Follow cultures.  General surgery following, will follow their recommendations regarding timing of surgery.  Hep C antibody positive -Awaiting hep C RNA PCR.  Patient will need outpatient follow-up with primary care provider and/or infectious diseases regarding the same.  Patient will not be started on antiviral treatment while patient is inpatient.  Leukocytosis -Probably secondary to above.  Continue antibiotics.  Resolved.  Polysubstance abuse -Urine drug screen positive for cocaine and amphetamines.  Patient denies any active use.  Counseled about this side effects of the same and encouraged cessation.  AKI:  -Probably secondary to poor oral intake and dehydration.  Resolved.  Discontinue IV fluids.  Thrombocytopenia:  -Unclear etiology.  No signs of bleeding.  Repeat a.m. labs.  Improving   DVT prophylaxis: Heparin  code Status: Full Family Communication:  None at bedside Disposition Plan: Depends on further surgical recommendations regarding timing of surgery.  If no plan for inpatient surgery, patient probably will be discharged home tomorrow.  Consultants: GI/general surgery  Procedures: None  Antimicrobials: Zosyn from 03/19/2018 onwards   Subjective: Patient seen and examined at bedside.  Patient denies any overnight fever, nausea, vomiting.  He is tolerating diet.  Objective: Vitals:   03/21/18 0436 03/21/18 1405 03/21/18 2108 03/22/18 0627  BP: 119/60 119/63 131/66 124/71  Pulse: 66 71 67 69  Resp: 19  19 18   Temp: 97.8 F (36.6 C) 98.6 F (37 C) 98.2 F (36.8 C) 97.9 F (36.6 C)  TempSrc: Oral Oral Oral Oral  SpO2: 96% 100% 99% 97%  Weight:      Height:        Intake/Output Summary (Last 24 hours) at 03/22/2018 1333 Last data filed at 03/22/2018 0900 Gross per 24 hour  Intake 600 ml  Output -  Net 600 ml   Filed Weights   03/19/18 1740 03/20/18 0100  Weight: 63.5 kg (140 lb) 81.9 kg (180 lb 8.9 oz)    Examination:  General exam: No acute distress Eyes: Icterus present, no pallor Respiratory system: Bilateral decreased breath sound at bases, no wheezing or crackles Cardiovascular system: Rate controlled, S1-S2 positive gastrointestinal system: Abdomen is nondistended, soft and nontender.  Normal bowel sounds heard  extremities: No cyanosis, edema   Data Reviewed: I have personally reviewed following labs and imaging studies  CBC: Recent Labs  Lab 03/19/18 1636 03/20/18 0142 03/21/18 0549 03/22/18 0532  WBC 11.9* 15.8* 9.8 6.1  NEUTROABS  --   --  2.1 2.1  HGB 18.1* 16.7 13.8 12.7*  HCT 50.0 46.5 40.0 37.5*  MCV 84.2 82.0 84.9 85.4  PLT 120* 113*  113* 112* 878*   Basic Metabolic Panel: Recent Labs  Lab 03/19/18 1636 03/20/18 0142 03/21/18 0549 03/22/18 0532  NA 134* 133* 136 139  K 3.6 3.5 3.6 3.5  CL 96* 100* 103 107  CO2 24 20* 22 24  GLUCOSE 157* 99 99 90  BUN 31* 28* 18 19    CREATININE 1.79* 1.36* 1.11 1.05  CALCIUM 9.1 8.5* 8.4* 8.5*  MG  --   --  2.0 1.9   GFR: Estimated Creatinine Clearance: 100.1 mL/min (by C-G formula based on SCr of 1.05 mg/dL). Liver Function Tests: Recent Labs  Lab 03/19/18 1636 03/20/18 0142 03/21/18 0549 03/22/18 0532  AST 2,421* 1,431* 467* 223*  ALT 5,232* 3,914* 2,051* 1,346*  ALKPHOS 190* 164* 163* 186*  BILITOT 11.3* 10.6* 6.5* 3.8*  PROT 6.8 6.2* 5.4* 5.6*  ALBUMIN 3.5 3.1* 2.7* 2.6*   Recent Labs  Lab 03/19/18 1636  LIPASE 38   Recent Labs  Lab 03/19/18 1659  AMMONIA 28   Coagulation Profile: Recent Labs  Lab 03/19/18 2131 03/20/18 0142 03/22/18 0532  INR 1.25 1.25 0.99   Cardiac Enzymes: Recent Labs  Lab 03/19/18 2131  CKTOTAL 30*   BNP (last 3 results) No results for input(s): PROBNP in the last 8760 hours. HbA1C: No results for input(s): HGBA1C in the last 72 hours. CBG: No results for input(s): GLUCAP in the last 168 hours. Lipid Profile: No results for input(s): CHOL, HDL, LDLCALC, TRIG, CHOLHDL, LDLDIRECT in the last 72 hours. Thyroid Function Tests: No results for input(s): TSH, T4TOTAL, FREET4, T3FREE, THYROIDAB in the last 72 hours. Anemia Panel: No results for input(s): VITAMINB12, FOLATE, FERRITIN, TIBC, IRON, RETICCTPCT in the last 72 hours. Sepsis Labs: Recent Labs  Lab 03/19/18 1643 03/19/18 1913  LATICACIDVEN 3.69* 2.39*    Recent Results (from the past 240 hour(s))  Blood Culture (routine x 2)     Status: None (Preliminary result)   Collection Time: 03/19/18  5:02 PM  Result Value Ref Range Status   Specimen Description   Final    BLOOD LEFT HAND Performed at [facility] [address] [place], [place] [address]    Special Requests   Final    BOTTLES DRAWN AEROBIC AND ANAEROBIC Blood Culture adequate volume Performed at [facility] [address], [place], [place] [address]    Culture   Final    NO GROWTH 3  DAYS Performed at [facility], [place] [address], [place], [place] [address]    Report Status PENDING  Incomplete  Blood Culture (routine x 2)     Status: None (Preliminary result)   Collection Time: 03/19/18  5:07 PM  Result Value Ref Range Status   Specimen Description   Final    BLOOD LEFT ARM Performed at [facility] [address] [place], [place] [address]    Special Requests   Final    BOTTLES DRAWN AEROBIC AND ANAEROBIC Blood Culture adequate volume Performed at [facility] [address] [place], [place] [address]    Culture   Final    NO GROWTH 3 DAYS Performed at [facility], [place] [address] [place], [place] [address]    Report Status PENDING  Incomplete         Radiology Studies: No results found.  Scheduled Meds: . heparin  5,000 Units Subcutaneous Q8H   Continuous Infusions: . piperacillin-tazobactam (ZOSYN)  IV Stopped (03/22/18 0930)     LOS: 3 days        Aline August, MD [facility] Pager [phone]  If 7PM-7AM, please contact night-coverage [email or web address] Password [email or web address] 03/22/2018, 1:33 PM

## 2018-03-22 NOTE — Progress Notes (Signed)
[name], male   DOB: 12-29-1985, 32 y.o.   MRN: [id number] [facility] Surgery Progress Note:   * No surgery found *  Subjective: Mental status is clear.  Eating a hearty breakfast Objective: Vital signs in last 24 hours: Temp:  [97.9 F (36.6 C)-98.6 F (37 C)] 97.9 F (36.6 C) (05/11 0627) Pulse Rate:  [67-71] 69 (05/11 0627) Resp:  [18-19] 18 (05/11 0627) BP: (119-131)/(63-71) 124/71 (05/11 0627) SpO2:  [97 %-100 %] 97 % (05/11 0627)  Intake/Output from previous day: 05/10 0701 - 05/11 0700 In: 600 [P.O.:600] Out: -  Intake/Output this shift: No intake/output data recorded.  Physical Exam: Work of breathing is normal;  Jaundice apparent  Lab Results:  Results for orders placed or performed during the hospital encounter of 03/19/18 (from the past 48 hour(s))  CBC with Differential/Platelet     Status: Abnormal   Collection Time: 03/21/18  5:49 AM  Result Value Ref Range   WBC 9.8 4.0 - 10.5 K/uL   RBC 4.71 4.22 - 5.81 MIL/uL   Hemoglobin 13.8 13.0 - 17.0 g/dL   HCT 40.0 39.0 - 52.0 %   MCV 84.9 78.0 - 100.0 fL   MCH 29.3 26.0 - 34.0 pg   MCHC 34.5 30.0 - 36.0 g/dL   RDW 14.0 11.5 - 15.5 %   Platelets 112 (L) 150 - 400 K/uL    Comment: CONSISTENT WITH PREVIOUS RESULT   Neutrophils Relative % 21 %   Lymphocytes Relative 72 %   Monocytes Relative 4 %   Eosinophils Relative 3 %   Basophils Relative 0 %   Neutro Abs 2.1 1.7 - 7.7 K/uL   Lymphs Abs 7.0 (H) 0.7 - 4.0 K/uL   Monocytes Absolute 0.4 0.1 - 1.0 K/uL   Eosinophils Absolute 0.3 0.0 - 0.7 K/uL   Basophils Absolute 0.0 0.0 - 0.1 K/uL   WBC Morphology ABSOLUTE LYMPHOCYTOSIS     Comment: Performed at [facility], [place] [address], [place], [place] [address]  Comprehensive metabolic panel     Status: Abnormal   Collection Time: 03/21/18  5:49 AM  Result Value Ref Range   Sodium 136 135 - 145 mmol/L   Potassium 3.6 3.5 - 5.1 mmol/L   Chloride 103 101 - 111 mmol/L   CO2 22  22 - 32 mmol/L   Glucose, Bld 99 65 - 99 mg/dL   BUN 18 6 - 20 mg/dL   Creatinine, Ser 1.11 0.61 - 1.24 mg/dL   Calcium 8.4 (L) 8.9 - 10.3 mg/dL   Total Protein 5.4 (L) 6.5 - 8.1 g/dL   Albumin 2.7 (L) 3.5 - 5.0 g/dL   AST 467 (H) 15 - 41 U/L   ALT 2,051 (H) 17 - 63 U/L   Alkaline Phosphatase 163 (H) 38 - 126 U/L   Total Bilirubin 6.5 (H) 0.3 - 1.2 mg/dL    Comment: DELTA CHECK NOTED REPEATED TO VERIFY    GFR calc non Af Amer >60 >60 mL/min   GFR calc Af Amer >60 >60 mL/min    Comment: (NOTE) The eGFR has been calculated using the CKD EPI equation. This calculation has not been validated in all clinical situations. eGFR's persistently <60 mL/min signify possible Chronic Kidney Disease.    Anion gap 11 5 - 15    Comment: Performed at [facility], [place] [address] [place], [place] [address]  Magnesium     Status: None   Collection Time: 03/21/18  5:49 AM  Result Value Ref Range   Magnesium 2.0 1.7 - 2.4 mg/dL    Comment: Performed at [facility], [place] [address], [place], [place] [address]  Pathologist smear review     Status: None   Collection Time: 03/21/18  5:49 AM  Result Value Ref Range   Path Review 05.10.19     Comment:        Absolute lymphocytosis. Suggest immunophenotyping if a new, persistent finding. Reviewed By Violet Baldy, M.D. Performed at [facility], [place] [address], [place], [place] [address]   Hepatitis A antibody, total     Status: Abnormal   Collection Time: 03/21/18 11:49 AM  Result Value Ref Range   Hep A Total Ab Positive (A) Negative    Comment: (NOTE) Performed At: [facility] [place], [place] [id number] Rush Farmer MD [id number] Performed at [facility], [place] [address] [place], [place] [address]   Hepatitis B surface antibody     Status: None   Collection Time: 03/21/18 11:49 AM  Result Value Ref Range   Hep B S Ab Non Reactive      Comment: (NOTE)              Non Reactive: Inconsistent with immunity,                            less than 10 mIU/mL              Reactive:     Consistent with immunity,                            greater than 9.9 mIU/mL Performed At: [facility] [place], [place] [id number] Rush Farmer MD [id number] Performed at [facility], [place] [address], [place], [place] [address]   Hepatitis B surface antigen     Status: None   Collection Time: 03/21/18 11:49 AM  Result Value Ref Range   Hepatitis B Surface Ag Negative Negative    Comment: (NOTE) Performed At: [facility] [place], [place] [id number] Rush Farmer MD [id number] Performed at [facility], [place] [address], [place], [place] [address]   CBC with Differential/Platelet     Status: Abnormal   Collection Time: 03/22/18  5:32 AM  Result Value Ref Range   WBC 6.1 4.0 - 10.5 K/uL   RBC 4.39 4.22 - 5.81 MIL/uL   Hemoglobin 12.7 (L) 13.0 - 17.0 g/dL   HCT 37.5 (L) 39.0 - 52.0 %   MCV 85.4 78.0 - 100.0 fL   MCH 28.9 26.0 - 34.0 pg   MCHC 33.9 30.0 - 36.0 g/dL   RDW 14.3 11.5 - 15.5 %   Platelets 132 (L) 150 - 400 K/uL   Neutrophils Relative % 35 %   Lymphocytes Relative 55 %   Monocytes Relative 6 %   Eosinophils Relative 4 %   Basophils Relative 0 %   Neutro Abs 2.1 1.7 - 7.7 K/uL   Lymphs Abs 3.4 0.7 - 4.0 K/uL   Monocytes Absolute 0.4 0.1 - 1.0 K/uL   Eosinophils Absolute 0.2 0.0 - 0.7 K/uL   Basophils Absolute 0.0 0.0 - 0.1 K/uL   WBC Morphology ATYPICAL LYMPHOCYTES     Comment: Performed at [facility], [place] [address] [place],  [address]  Comprehensive  metabolic panel     Status: Abnormal   Collection Time: 03/22/18  5:32 AM  Result Value Ref Range   Sodium 139 135 - 145 mmol/L   Potassium 3.5 3.5 - 5.1 mmol/L   Chloride 107 101 - 111 mmol/L   CO2 24 22 - 32 mmol/L   Glucose, Bld 90 65 - 99  mg/dL   BUN 19 6 - 20 mg/dL   Creatinine, Ser 1.05 0.61 - 1.24 mg/dL   Calcium 8.5 (L) 8.9 - 10.3 mg/dL   Total Protein 5.6 (L) 6.5 - 8.1 g/dL   Albumin 2.6 (L) 3.5 - 5.0 g/dL   AST 223 (H) 15 - 41 U/L   ALT 1,346 (H) 17 - 63 U/L   Alkaline Phosphatase 186 (H) 38 - 126 U/L   Total Bilirubin 3.8 (H) 0.3 - 1.2 mg/dL   GFR calc non Af Amer >60 >60 mL/min   GFR calc Af Amer >60 >60 mL/min    Comment: (NOTE) The eGFR has been calculated using the CKD EPI equation. This calculation has not been validated in all clinical situations. eGFR's persistently <60 mL/min signify possible Chronic Kidney Disease.    Anion gap 8 5 - 15    Comment: Performed at [facility], [place] [address] [place], [place] [address]  Magnesium     Status: None   Collection Time: 03/22/18  5:32 AM  Result Value Ref Range   Magnesium 1.9 1.7 - 2.4 mg/dL    Comment: Performed at [facility], [place] [address], [place], [place] [address]  Protime-INR Once     Status: None   Collection Time: 03/22/18  5:32 AM  Result Value Ref Range   Prothrombin Time 13.0 11.4 - 15.2 seconds   INR 0.99     Comment: Performed at [facility], [place] [address], [place], [place] [address]    Radiology/Results: No results found.  Anti-infectives: Anti-infectives (From admission, onward)   Start     Dose/Rate Route Frequency Ordered Stop   03/20/18 0130  piperacillin-tazobactam (ZOSYN) IVPB 3.375 g     3.375 g 12.5 mL/hr over 240 Minutes Intravenous Every 8 hours 03/20/18 0112     03/19/18 1715  piperacillin-tazobactam (ZOSYN) IVPB 3.375 g     3.375 g 100 mL/hr over 30 Minutes Intravenous  Once 03/19/18 1702 03/19/18 1803      Assessment/Plan: Problem List: Patient Active Problem List   Diagnosis Date Noted  . Hepatitis C antibody positive in blood   . Cocaine abuse ([place])   . AKI (acute kidney injury) ([place]) 03/19/2018  . Elevated liver enzymes 03/19/2018  . Elevated  LFTs 03/19/2018  . Back pain, chronic 07/25/2011    Needs treatment for his Hepatitis C before managing his gallbladder operatively.   * No surgery found *    LOS: 3 days   Matt B. Hassell Done, MD, [facility] [phone] beeper [phone]  03/22/2018 9:21 AM

## 2018-03-23 LAB — COMPREHENSIVE METABOLIC PANEL
ALK PHOS: 156 U/L — AB (ref 38–126)
ALT: 986 U/L — AB (ref 17–63)
AST: 132 U/L — AB (ref 15–41)
Albumin: 2.9 g/dL — ABNORMAL LOW (ref 3.5–5.0)
Anion gap: 9 (ref 5–15)
BILIRUBIN TOTAL: 2.7 mg/dL — AB (ref 0.3–1.2)
BUN: 20 mg/dL (ref 6–20)
CALCIUM: 8.7 mg/dL — AB (ref 8.9–10.3)
CHLORIDE: 106 mmol/L (ref 101–111)
CO2: 29 mmol/L (ref 22–32)
Creatinine, Ser: 1.15 mg/dL (ref 0.61–1.24)
GFR calc Af Amer: >60 mL/min (ref >60)
Glucose, Bld: 103 mg/dL — ABNORMAL HIGH (ref 65–99)
Potassium: 3.7 mmol/L (ref 3.5–5.1)
Sodium: 144 mmol/L (ref 135–145)
TOTAL PROTEIN: 6.2 g/dL — AB (ref 6.5–8.1)

## 2018-03-23 LAB — CBC WITH DIFFERENTIAL/PLATELET
BASOS ABS: 0 10*3/uL (ref 0.0–0.1)
Basophils Relative: 0 %
Eosinophils Absolute: 0.2 10*3/uL (ref 0.0–0.7)
Eosinophils Relative: 3 %
HEMATOCRIT: 38.5 % — AB (ref 39.0–52.0)
Hemoglobin: 12.8 g/dL — ABNORMAL LOW (ref 13.0–17.0)
LYMPHS ABS: 2.4 10*3/uL (ref 0.7–4.0)
LYMPHS PCT: 40 %
MCH: 29.2 pg (ref 26.0–34.0)
MCHC: 33.2 g/dL (ref 30.0–36.0)
MCV: 87.9 fL (ref 78.0–100.0)
Monocytes Absolute: 0.4 10*3/uL (ref 0.1–1.0)
Monocytes Relative: 7 %
NEUTROS ABS: 3 10*3/uL (ref 1.7–7.7)
Neutrophils Relative %: 50 %
Platelets: 178 10*3/uL (ref 150–400)
RBC: 4.38 MIL/uL (ref 4.22–5.81)
RDW: 14.3 % (ref 11.5–15.5)
WBC: 6 10*3/uL (ref 4.0–10.5)

## 2018-03-23 LAB — MAGNESIUM: MAGNESIUM: 1.9 mg/dL (ref 1.7–2.4)

## 2018-03-23 LAB — ANTINUCLEAR ANTIBODIES, IFA: ANA Ab, IFA: NEGATIVE

## 2018-03-23 NOTE — Progress Notes (Signed)
Discharge instructions and medications discussed with patient.  AVS given to patient.  All questions answered.  

## 2018-03-23 NOTE — Discharge Summary (Signed)
Physician Discharge Summary  Jeffrey Wise JME:[id number] DOB: 29-Dec-1985 DOA: 03/19/2018  PCP: Patient, No Pcp Per  Admit date: 03/19/2018 Discharge date: 03/23/2018  Admitted From: Home Disposition: Home  Recommendations for Outpatient Follow-up:  1. Follow up with PCP in 1 week with repeat CBC/CMP 2. Follow-up with gastroenterology/general surgery/infectious diseases as an outpatient  Home Health:  No Equipment/Devices: None  Discharge Condition: Stable CODE STATUS: Full  Diet recommendation:  Regular   Brief/Interim Summary: 32 year old male with no past medical history presented with nausea, vomiting and abdominal pain.  He was found to have elevated LFTs and CT of the abdomen pelvis and ultrasound showed cholelithiasis with gallbladder thickening.  ED provider consulted GI.  Patient was started empirically on IV Zosyn.  General surgery was also consulted. LFTs started gradually improving. Hep C antibody was found to be positive. Patient will need outpatient follow up with PCP and/or ID. He needs to be abstinent of drugs to be considered for treatment for Hep C. General surgery will follow up as an outpatient for gall stones and think that patient doesn't need any antibiotics. GI has cleared for discharge. Patient will be discharged home today.   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   -LFTs improving. -Hep C antibody positive. Patient will need outpatient follow up with PCP and/or ID. He needs to be abstinent of drugs to be considered for treatment for Hep C. General surgery will follow up as an outpatient for gall stones and think that patient doesn't need any antibiotics. GI has  cleared for discharge. Patient will be discharged home today. -Tolerating diet. -outpatient follow up of LFTs  Cholelithiasis with probable cholecystitis -Currently on Zosyn. General surgery will follow up as an outpatient for gall stones and think that patient doesn't need any antibiotics and think that patient has hepatitis not cholecystitis.  Discontinue antibiotics on discharge   Hep C antibody positive -  Patient will need outpatient follow-up with primary care provider and/or infectious diseases regarding the same.  Patient will not be started on antiviral treatment while patient is inpatient.  Leukocytosis -Probably secondary to above.  Continue antibiotics.  Resolved.  Polysubstance abuse -Urine drug screen positive for cocaine and amphetamines.  Patient denies any active use.  Counseled about this side effects of the same and encouraged cessation.  AKI:  -Probably secondary to poor oral intake and dehydration.  Resolved.    Thrombocytopenia:  -Unclear etiology.  No signs of bleeding.    Resolved    Discharge Instructions  Discharge Instructions    Ambulatory referral to Gastroenterology   Complete by:  As directed    Elevated lfts   What is the reason for referral?:  Other   Ambulatory referral to Infectious Disease   Complete by:  As directed    Recent diagnosis of HepC   Diet general   Complete by:  As directed    Increase activity slowly   Complete by:  As directed      Allergies as of 03/23/2018   No Known Allergies     Medication List    STOP taking these medications   ibuprofen 600 MG tablet Commonly known as:  ADVIL,MOTRIN   methocarbamol  500 MG tablet Commonly known as:  ROBAXIN      Follow-up Information    PCP. Schedule an appointment as soon as possible for a visit in 1 week(s).   Why:  With repeat CBC/CMP       Autumn Messing III, MD. Schedule an appointment as soon as possible for a visit in 2 week(s).   Specialty:  General  Surgery Contact information: [address] [place] [address] [phone]        Mauri Pole, MD. Schedule an appointment as soon as possible for a visit in 1 week(s).   Specialty:  Gastroenterology Contact information: [place] [address] [phone]          No Known Allergies  Consultations:  GI/general surgery   Procedures/Studies: Ct Abdomen Pelvis W Contrast  Result Date: 03/19/2018 CLINICAL DATA:  Abdominal pain with nausea and vomiting 7 days. EXAM: CT ABDOMEN AND PELVIS WITH CONTRAST TECHNIQUE: Multidetector CT imaging of the abdomen and pelvis was performed using the standard protocol following bolus administration of intravenous contrast. CONTRAST:  116mL ISOVUE-300 IOPAMIDOL (ISOVUE-300) INJECTION 61% COMPARISON:  12/21/2010 FINDINGS: Lower chest: Lung bases are normal. Couple adjacent right pericardiophrenic lymph nodes with the larger measuring 9 mm by short axis. Hepatobiliary: Liver and biliary tree are normal. Significant gallbladder wall thickening and possible small amount of pericholecystic fluid. No definite sludge or cholelithiasis. Pancreas: Normal. Spleen: Mild splenomegaly. Adrenals/Urinary Tract: Adrenal glands are normal. 2 mm stone over the upper pole collecting system of the left kidney. No right renal stones. No hydronephrosis. Ureters and bladder are normal. Stomach/Bowel: Stomach and small bowel are within normal. Appendix is normal. Colon is unremarkable. Vascular/Lymphatic: Abdominal aorta is normal. Remaining vascular structures are unremarkable. No adenopathy. Reproductive: Small amount of free fluid in the pelvis. Normal prostate. Other: No acute inflammatory change. Musculoskeletal: Unremarkable. IMPRESSION: Significant gallbladder wall thickening and possible small amount of pericholecystic fluid. No significant adjacent inflammatory change and no definite stones or sludge. Recommend right upper quadrant  ultrasound for further evaluation. 2 mm nonobstructing left renal stone. Small amount of nonspecific free fluid in the pelvis. Electronically Signed   By: Marin Olp M.D.   On: 03/19/2018 19:15   US Liver Doppler  Result Date: 03/19/2018 CLINICAL DATA:  Nausea and vomiting EXAM: DUPLEX ULTRASOUND OF LIVER ULTRASOUND ABDOMEN LIMITED RIGHT UPPER QUADRANT; TECHNIQUE: Color and duplex Doppler ultrasound was performed to evaluate the hepatic in-flow and out-flow vessels. COMPARISON:  None. FINDINGS: Gallbladder ultrasound: Gallbladder: Gallbladder wall edema with single wall thickness of the gallbladder of 9.1 mm. Several gallstones are also present, the largest measuring 2.2 cm. No sonographic Murphy's sign however. No pericholecystic fluid. Common bile duct: Diameter: 3.4 mm and normal Liver: No focal lesion identified. Within normal limits in parenchymal echogenicity. Portal vein is patent on color Doppler imaging with normal direction of blood flow towards the liver. Liver Doppler ultrasound : Portal vein size: 1.2 cm Portal venous velocities: Proximal portal vein 24.2 cm/second and hepatopetal, mid portal vein velocity 64.7 cm and hepatopetal, and distal portal vein velocity 100 cm/second, also hepatopetal. Right portal vein velocity: 23.3 cm/second and hepatopetal. Left portal vein velocity 47.5 cm and hepatopetal Hepatic Vein Velocities Right:  23.5  cm/sec and hepatofugal Middle:  35.1  cm/sec and hepatofugal Left: 25.5  cm/sec and hepatofugal Hepatic Artery Velocity:  99.8  cm/sec Splenic Vein Velocity:  33.1 cm/sec Varices: Absent Ascites: Absent No portal or splenic vein occlusion or thrombus.  IMPRESSION: 1. Cholelithiasis with gallbladder wall thickening concerning for changes of cholecystitis, more likely chronic given lack of sonographic Murphy sign and other ancillary findings such as gallbladder distention and pericholecystic fluid. 2. Normal hepatopetal pedal flow towards the liver within the  portal venous system and hepatofugal flow away from the liver within the hepatic venous system. 3. Absent varices, ascites, portal/splenic vein occlusion and thrombus. Electronically Signed   By: Ashley Royalty M.D.   On: 03/19/2018 23:25   US Abdomen Limited Ruq  Result Date: 03/19/2018 CLINICAL DATA:  Nausea and vomiting EXAM: DUPLEX ULTRASOUND OF LIVER ULTRASOUND ABDOMEN LIMITED RIGHT UPPER QUADRANT; TECHNIQUE: Color and duplex Doppler ultrasound was performed to evaluate the hepatic in-flow and out-flow vessels. COMPARISON:  None. FINDINGS: Gallbladder ultrasound: Gallbladder: Gallbladder wall edema with single wall thickness of the gallbladder of 9.1 mm. Several gallstones are also present, the largest measuring 2.2 cm. No sonographic Murphy's sign however. No pericholecystic fluid. Common bile duct: Diameter: 3.4 mm and normal Liver: No focal lesion identified. Within normal limits in parenchymal echogenicity. Portal vein is patent on color Doppler imaging with normal direction of blood flow towards the liver. Liver Doppler ultrasound : Portal vein size: 1.2 cm Portal venous velocities: Proximal portal vein 24.2 cm/second and hepatopetal, mid portal vein velocity 64.7 cm and hepatopetal, and distal portal vein velocity 100 cm/second, also hepatopetal. Right portal vein velocity: 23.3 cm/second and hepatopetal. Left portal vein velocity 47.5 cm and hepatopetal Hepatic Vein Velocities Right:  23.5  cm/sec and hepatofugal Middle:  35.1  cm/sec and hepatofugal Left: 25.5  cm/sec and hepatofugal Hepatic Artery Velocity:  99.8  cm/sec Splenic Vein Velocity:  33.1 cm/sec Varices: Absent Ascites: Absent No portal or splenic vein occlusion or thrombus. IMPRESSION: 1. Cholelithiasis with gallbladder wall thickening concerning for changes of cholecystitis, more likely chronic given lack of sonographic Murphy sign and other ancillary findings such as gallbladder distention and pericholecystic fluid. 2. Normal hepatopetal  pedal flow towards the liver within the portal venous system and hepatofugal flow away from the liver within the hepatic venous system. 3. Absent varices, ascites, portal/splenic vein occlusion and thrombus. Electronically Signed   By: Ashley Royalty M.D.   On: 03/19/2018 23:25     Subjective: Patient seen and examined at bedside.  He denies any overnight fever, nausea, vomiting.  He is tolerating diet.  Discharge Exam: Vitals:   03/22/18 2045 03/23/18 0518  BP: 111/79 118/88  Pulse: 66 73  Resp: 17 18  Temp: 97.9 F (36.6 C) 97.8 F (36.6 C)  SpO2: 98% 97%   Vitals:   03/22/18 1458 03/22/18 1542 03/22/18 2045 03/23/18 0518  BP: 117/78 127/73 111/79 118/88  Pulse: 95 (!) 57 66 73  Resp: 18 16 17 18   Temp: 99 F (37.2 C) 98.6 F (37 C) 97.9 F (36.6 C) 97.8 F (36.6 C)  TempSrc: Oral Oral Oral Oral  SpO2: 98% 100% 98% 97%  Weight:      Height:        General: Pt is alert, awake, not in acute distress Cardiovascular: Rate controlled, S1/S2 + Respiratory: CTA bilaterally, no wheezing, no rhonchi Abdominal: Soft, NT, ND, bowel sounds + Extremities: no edema, no cyanosis    The results of significant diagnostics from this hospitalization (including imaging, microbiology, ancillary and laboratory) are listed below for reference.     Microbiology: Recent Results (from the past 240 hour(s))  Blood Culture (routine x 2)     Status: None (Preliminary result)   Collection  Time: 03/19/18  5:02 PM  Result Value Ref Range Status   Specimen Description   Final    BLOOD LEFT HAND Performed at [facility] [address], [place], [place] [address]    Special Requests   Final    BOTTLES DRAWN AEROBIC AND ANAEROBIC Blood Culture adequate volume Performed at [facility] [address] [place], [place] [address]    Culture   Final    NO GROWTH 3 DAYS Performed at [facility], [place] [address], [place], [place] [address]    Report  Status PENDING  Incomplete  Blood Culture (routine x 2)     Status: None (Preliminary result)   Collection Time: 03/19/18  5:07 PM  Result Value Ref Range Status   Specimen Description   Final    BLOOD LEFT ARM Performed at [facility] [address], [place], [place] [address]    Special Requests   Final    BOTTLES DRAWN AEROBIC AND ANAEROBIC Blood Culture adequate volume Performed at [facility] [address] [place], [place] [address]    Culture   Final    NO GROWTH 3 DAYS Performed at [facility], [place] [address], [place], [place] [address]    Report Status PENDING  Incomplete     Labs: BNP (last 3 results) No results for input(s): BNP in the last 8760 hours. Basic Metabolic Panel: Recent Labs  Lab 03/19/18 1636 03/20/18 0142 03/21/18 0549 03/22/18 0532 03/23/18 0539  NA 134* 133* 136 139 144  K 3.6 3.5 3.6 3.5 3.7  CL 96* 100* 103 107 106  CO2 24 20* 22 24 29   GLUCOSE 157* 99 99 90 103*  BUN 31* 28* 18 19 20   CREATININE 1.79* 1.36* 1.11 1.05 1.15  CALCIUM 9.1 8.5* 8.4* 8.5* 8.7*  MG  --   --  2.0 1.9 1.9   Liver Function Tests: Recent Labs  Lab 03/19/18 1636 03/20/18 0142 03/21/18 0549 03/22/18 0532 03/23/18 0539  AST 2,421* 1,431* 467* 223* 132*  ALT 5,232* 3,914* 2,051* 1,346* 986*  ALKPHOS 190* 164* 163* 186* 156*  BILITOT 11.3* 10.6* 6.5* 3.8* 2.7*  PROT 6.8 6.2* 5.4* 5.6* 6.2*  ALBUMIN 3.5 3.1* 2.7* 2.6* 2.9*   Recent Labs  Lab 03/19/18 1636  LIPASE 38   Recent Labs  Lab 03/19/18 1659  AMMONIA 28   CBC: Recent Labs  Lab 03/19/18 1636 03/20/18 0142 03/21/18 0549 03/22/18 0532 03/23/18 0539  WBC 11.9* 15.8* 9.8 6.1 6.0  NEUTROABS  --   --  2.1 2.1 3.0  HGB 18.1* 16.7 13.8 12.7* 12.8*  HCT 50.0 46.5 40.0 37.5* 38.5*  MCV 84.2 82.0 84.9 85.4 87.9  PLT 120* 113*  113* 112* 132* 178   Cardiac Enzymes: Recent Labs  Lab 03/19/18 2131  CKTOTAL 30*   BNP: Invalid input(s):  POCBNP CBG: No results for input(s): GLUCAP in the last 168 hours. D-Dimer No results for input(s): DDIMER in the last 72 hours. Hgb A1c No results for input(s): HGBA1C in the last 72 hours. Lipid Profile No results for input(s): CHOL, HDL, LDLCALC, TRIG, CHOLHDL, LDLDIRECT in the last 72 hours. Thyroid function studies No results for input(s): TSH, T4TOTAL, T3FREE, THYROIDAB in the last 72 hours.  Invalid input(s): FREET3 Anemia work up No results for input(s): VITAMINB12, FOLATE, FERRITIN, TIBC, IRON, RETICCTPCT in the last 72 hours. Urinalysis    Component Value Date/Time   COLORURINE AMBER (A)  03/19/2018 1707   APPEARANCEUR CLOUDY (A) 03/19/2018 1707   LABSPEC 1.020 03/19/2018 1707   PHURINE 5.0 03/19/2018 1707   GLUCOSEU NEGATIVE 03/19/2018 1707   HGBUR SMALL (A) 03/19/2018 1707   BILIRUBINUR MODERATE (A) 03/19/2018 1707   KETONESUR NEGATIVE 03/19/2018 1707   PROTEINUR 30 (A) 03/19/2018 1707   UROBILINOGEN 1.0 05/30/2011 0957   NITRITE NEGATIVE 03/19/2018 1707   LEUKOCYTESUR NEGATIVE 03/19/2018 1707   Sepsis Labs Invalid input(s): PROCALCITONIN,  WBC,  LACTICIDVEN Microbiology Recent Results (from the past 240 hour(s))  Blood Culture (routine x 2)     Status: None (Preliminary result)   Collection Time: 03/19/18  5:02 PM  Result Value Ref Range Status   Specimen Description   Final    BLOOD LEFT HAND Performed at [facility], [place] [address], [place], [place] [address]    Special Requests   Final    BOTTLES DRAWN AEROBIC AND ANAEROBIC Blood Culture adequate volume Performed at [facility] [address], [place], [place] [address]    Culture   Final    NO GROWTH 3 DAYS Performed at [facility], [place] [address] [place], [place] [address]    Report Status PENDING  Incomplete  Blood Culture (routine x 2)     Status: None (Preliminary result)   Collection Time: 03/19/18  5:07 PM  Result Value Ref Range Status    Specimen Description   Final    BLOOD LEFT ARM Performed at [facility] [address] [place], [place] [address]    Special Requests   Final    BOTTLES DRAWN AEROBIC AND ANAEROBIC Blood Culture adequate volume Performed at [facility] [address] [place], [place] [address]    Culture   Final    NO GROWTH 3 DAYS Performed at [facility], [place] [address], [place], [place] [address]    Report Status PENDING  Incomplete     Time coordinating discharge: Over 30 minutes  SIGNED:   Aline August, MD  [facility] 03/23/2018, 10:30 AM Pager: [phone]  If 7PM-7AM, please contact night-coverage [email or web address] Password [email or web address]

## 2018-03-23 NOTE — Progress Notes (Signed)
   Subjective/Chief Complaint: No complaints. Denies abd pain   Objective: Vital signs in last 24 hours: Temp:  [97.8 F (36.6 C)-99 F (37.2 C)] 97.8 F (36.6 C) (05/12 0518) Pulse Rate:  [57-95] 73 (05/12 0518) Resp:  [16-18] 18 (05/12 0518) BP: (111-127)/(73-88) 118/88 (05/12 0518) SpO2:  [97 %-100 %] 97 % (05/12 0518) Last BM Date: 03/22/18  Intake/Output from previous day: 05/11 0701 - 05/12 0700 In: 1110 [P.O.:960; IV Piggyback:150] Out: -  Intake/Output this shift: No intake/output data recorded.  General appearance: alert and cooperative Resp: clear to auscultation bilaterally Cardio: regular rate and rhythm GI: soft, nontender  Lab Results:  Recent Labs    03/22/18 0532 03/23/18 0539  WBC 6.1 6.0  HGB 12.7* 12.8*  HCT 37.5* 38.5*  PLT 132* 178   BMET Recent Labs    03/22/18 0532 03/23/18 0539  NA 139 144  K 3.5 3.7  CL 107 106  CO2 24 29  GLUCOSE 90 103*  BUN 19 20  CREATININE 1.05 1.15  CALCIUM 8.5* 8.7*   PT/INR Recent Labs    03/22/18 0532  LABPROT 13.0  INR 0.99   ABG No results for input(s): PHART, HCO3 in the last 72 hours.  Invalid input(s): PCO2, PO2  Studies/Results: No results found.  Anti-infectives: Anti-infectives (From admission, onward)   Start     Dose/Rate Route Frequency Ordered Stop   03/20/18 0130  piperacillin-tazobactam (ZOSYN) IVPB 3.375 g     3.375 g 12.5 mL/hr over 240 Minutes Intravenous Every 8 hours 03/20/18 0112     03/19/18 1715  piperacillin-tazobactam (ZOSYN) IVPB 3.375 g     3.375 g 100 mL/hr over 30 Minutes Intravenous  Once 03/19/18 1702 03/19/18 1803      Assessment/Plan: s/p * No surgery found * Advance diet  Hep C. Will need to be treated prior to any surgery to remove gallbladder. May follow up with [place] when his treatment is complete. Call if we can help  LOS: 4 days    TOTH III,PAUL S 03/23/2018

## 2018-03-24 LAB — CULTURE, BLOOD (ROUTINE X 2)
CULTURE: NO GROWTH
Culture: NO GROWTH
SPECIAL REQUESTS: ADEQUATE
SPECIAL REQUESTS: ADEQUATE

## 2018-03-27 ENCOUNTER — Encounter (HOSPITAL_COMMUNITY): Payer: Self-pay | Admitting: Student

## 2018-03-31 ENCOUNTER — Encounter: Payer: Self-pay | Admitting: Internal Medicine

## 2020-06-09 ENCOUNTER — Encounter (HOSPITAL_COMMUNITY): Payer: Self-pay

## 2020-06-09 ENCOUNTER — Other Ambulatory Visit: Payer: Self-pay

## 2020-06-09 DIAGNOSIS — F17210 Nicotine dependence, cigarettes, uncomplicated: Secondary | ICD-10-CM | POA: Insufficient documentation

## 2020-06-09 DIAGNOSIS — J020 Streptococcal pharyngitis: Secondary | ICD-10-CM | POA: Insufficient documentation

## 2020-06-09 LAB — GROUP A STREP BY PCR: Group A Strep by PCR: DETECTED — AB

## 2020-06-09 NOTE — ED Triage Notes (Signed)
Pt sts sore throat that makes it painful to swallow or talk.

## 2020-06-10 ENCOUNTER — Emergency Department (HOSPITAL_COMMUNITY)
Admission: EM | Admit: 2020-06-10 | Discharge: 2020-06-10 | Disposition: A | Discharge status: Home / Self Care | Payer: Self-pay | Attending: Emergency Medicine | Admitting: Emergency Medicine

## 2020-06-10 DIAGNOSIS — J020 Streptococcal pharyngitis: Secondary | ICD-10-CM

## 2020-06-10 MED ORDER — DEXAMETHASONE SODIUM PHOSPHATE 10 MG/ML IJ SOLN
10.0000 mg | Freq: Once | INTRAMUSCULAR | Status: AC
  Administered 2020-06-10: 10 mg via INTRAMUSCULAR
  Filled 2020-06-10: qty 1

## 2020-06-10 MED ORDER — PENICILLIN G BENZATHINE 1200000 UNIT/2ML IM SUSP
1.2000 10*6.[IU] | Freq: Once | INTRAMUSCULAR | Status: AC
  Administered 2020-06-10: 1.2 10*6.[IU] via INTRAMUSCULAR
  Filled 2020-06-10: qty 2

## 2020-06-10 NOTE — Discharge Instructions (Addendum)
You were seen in the emergency department and diagnosed with strep throat.  You were given a shot of Penicillin and a shot of Decadron.  - Penicillin is an antibiotic used to treat the infection - Decadron is a steroid used to treat the pain and swelling of your throat.   You should gradually feel better over the next few days. Take Tylenol and Ibuprofen for fever and pain. Follow up with your primary care provider in the next 1 week if you are not feeling better, if you do not have a primary care provider one is provided in your discharge instructions. Return to the emergency department for any new or worsening symptoms including but not limited to inability to open your mouth, inability to move your neck, worsening pain, change in your voice, inability to swallow your own saliva, drooling, or any other concerns.

## 2020-06-10 NOTE — ED Provider Notes (Signed)
[facility] DEPT Provider Note   CSN: [id number] Arrival date & time: 06/09/20  2249     History Chief Complaint  Patient presents with  . Sore Throat    MAJD TISSUE is a 34 y.o. male with a history of tobacco abuse & cocaine abuse who presents to the ED with complaints of sore throat x 3 days. Pain is constant, 10/10 in severity, worse with swallowing, no alleviating factors. Able to swallow. No sick contacts w/ similar sxs. Denies fever, chills, congestion, ear pain, cough, or dyspnea.   HPI     History reviewed. No pertinent past medical history.  Patient Active Problem List   Diagnosis Date Noted  . Hepatitis C antibody positive in blood   . Cocaine abuse ([place])   . AKI (acute kidney injury) ([facility]) 03/19/2018  . Elevated liver enzymes 03/19/2018  . Elevated LFTs 03/19/2018  . Back pain, chronic 07/25/2011    Past Surgical History:  Procedure Laterality Date  . NO PAST SURGERIES         Family History  Problem Relation Age of Onset  . Lung cancer Father     Social History   Tobacco Use  . Smoking status: Current Every Day Smoker    Packs/day: 0.50    Types: Cigarettes  . Smokeless tobacco: Never Used  Vaping Use  . Vaping Use: Never used  Substance Use Topics  . Alcohol use: Never  . Drug use: Never    Home Medications Prior to Admission medications   Not on File    Allergies    Patient has no known allergies.  Review of Systems   Review of Systems  Constitutional: Negative for chills and fever.  HENT: Positive for sore throat and trouble swallowing (painful but able). Negative for congestion, ear pain and voice change.   Respiratory: Negative for cough and shortness of breath.   Cardiovascular: Negative for chest pain.  Gastrointestinal: Negative for abdominal pain, nausea and vomiting.  Neurological: Negative for syncope.    Physical Exam Updated Vital Signs BP 112/73 (BP Location: Right Arm)   Pulse  (!) 110   Temp 99.6 F (37.6 C) (Oral)   Resp 18   Ht 5\' 9"  (1.753 m)   Wt 68 kg   SpO2 100%   BMI 22.15 kg/m   Physical Exam Vitals and nursing note reviewed.  Constitutional:      General: He is not in acute distress.    Appearance: He is well-developed. He is not toxic-appearing.  HENT:     Head: Normocephalic and atraumatic.     Right Ear: Ear canal normal. Tympanic membrane is not perforated, erythematous, retracted or bulging.     Left Ear: Ear canal normal. Tympanic membrane is not perforated, erythematous, retracted or bulging.     Ears:     Comments: No mastoid erythema/swellng/tenderness.     Nose:     Right Sinus: No maxillary sinus tenderness or frontal sinus tenderness.     Left Sinus: No maxillary sinus tenderness or frontal sinus tenderness.     Mouth/Throat:     Pharynx: Oropharynx is clear. Uvula midline. Posterior oropharyngeal erythema present. No oropharyngeal exudate.     Tonsils: Tonsillar exudate present. 2+ on the right. 2+ on the left.     Comments: Posterior oropharynx is symmetric appearing. Patient tolerating own secretions without difficulty. No trismus. No drooling. No hot potato voice. No swelling beneath the tongue, submandibular compartment is soft.  Eyes:  General:        Right eye: No discharge.        Left eye: No discharge.     Conjunctiva/sclera: Conjunctivae normal.  Cardiovascular:     Rate and Rhythm: Normal rate and regular rhythm.  Pulmonary:     Effort: Pulmonary effort is normal. No respiratory distress.     Breath sounds: Normal breath sounds. No wheezing, rhonchi or rales.  Abdominal:     General: There is no distension.     Palpations: Abdomen is soft.     Tenderness: There is no abdominal tenderness.  Musculoskeletal:     Cervical back: Neck supple. No rigidity.  Lymphadenopathy:     Cervical: Cervical adenopathy present.  Skin:    General: Skin is warm and dry.     Findings: No rash.  Neurological:     Mental  Status: He is alert.  Psychiatric:        Behavior: Behavior normal.    ED Results / Procedures / Treatments   Labs (all labs ordered are listed, but only abnormal results are displayed) Labs Reviewed  GROUP A STREP BY PCR - Abnormal; Notable for the following components:      Result Value   Group A Strep by PCR DETECTED (*)    All other components within normal limits    EKG None  Radiology No results found.  Procedures Procedures (including critical care time)  Medications Ordered in ED Medications  penicillin g benzathine (BICILLIN LA) 1200000 UNIT/2ML injection 1.2 Million Units (has no administration in time range)  dexamethasone (DECADRON) injection 10 mg (has no administration in time range)    ED Course  I have reviewed the triage vital signs and the nursing notes.  Pertinent labs & imaging results that were available during my care of the patient were reviewed by me and considered in my medical decision making (see chart for details).    VINSON TIETZE was evaluated in Emergency Department on 06/10/2020 for the symptoms described in the history of present illness. He/she was evaluated in the context of the global COVID-19 pandemic, which necessitated consideration that the patient might be at risk for infection with the SARS-CoV-2 virus that causes COVID-19. Institutional protocols and algorithms that pertain to the evaluation of patients at risk for COVID-19 are in a state of rapid change based on information released by regulatory bodies including the CDC and federal and state organizations. These policies and algorithms were followed during the patient's care in the ED.  MDM Rules/Calculators/A&P                         Presents with complaint of sore throat.  Patient is nontoxic-appearing, vitals are within normal limits with the exception of mild tachycardia which resolved on my assessment.  On exam patient with tonsillar erythema, exudates, and anterior cervical  lymphadenopathy.  Strep test is positive.  Treated in the emergency department with IM Decadron and IM Penicillin.  Exam non concerning for PTA or RPA, there is no trismus, uvular deviation, or hot potato voice. Patient is tolerating own secretions without difficulty, full ROM of the neck, submandibular compartment is soft. Recommended use of Tylenol and Ibuprofen for any continued discomfort or fevers. I discussed results, treatment plan, need for PCP follow-up, and return precautions with the patient. Provided opportunity for questions, patient confirmed understanding and is in agreement with plan.   Final Clinical Impression(s) / ED Diagnoses Final diagnoses:  Strep  throat    Rx / DC Orders ED Discharge Orders    None       Amaryllis Dyke, PA-C 06/10/20 0024    Ezequiel Essex, MD 06/10/20 [phone]

## 2020-09-10 ENCOUNTER — Telehealth: Payer: Self-pay | Admitting: Family Medicine

## 2020-09-12 NOTE — Telephone Encounter (Signed)
Received after hours page for this number. This is not an [facility] patient.

## 2022-03-06 ENCOUNTER — Encounter (HOSPITAL_COMMUNITY): Payer: Self-pay

## 2022-03-06 ENCOUNTER — Emergency Department (HOSPITAL_COMMUNITY)
Admission: EM | Admit: 2022-03-06 | Discharge: 2022-03-06 | Disposition: A | Discharge status: Home / Self Care | Payer: Self-pay | Attending: Emergency Medicine | Admitting: Emergency Medicine

## 2022-03-06 ENCOUNTER — Emergency Department (HOSPITAL_COMMUNITY): Payer: Self-pay

## 2022-03-06 DIAGNOSIS — M25512 Pain in left shoulder: Secondary | ICD-10-CM

## 2022-03-06 DIAGNOSIS — S40012A Contusion of left shoulder, initial encounter: Secondary | ICD-10-CM | POA: Insufficient documentation

## 2022-03-06 DIAGNOSIS — W228XXA Striking against or struck by other objects, initial encounter: Secondary | ICD-10-CM | POA: Insufficient documentation

## 2022-03-06 MED ORDER — KETOROLAC TROMETHAMINE 15 MG/ML IJ SOLN
15.0000 mg | Freq: Once | INTRAMUSCULAR | Status: AC
Start: 2022-03-06 — End: 2022-03-06
  Administered 2022-03-06: 15 mg via INTRAMUSCULAR
  Filled 2022-03-06: qty 1

## 2022-03-06 NOTE — ED Notes (Signed)
I provided reinforced discharge education based off of discharge instructions. Pt acknowledged and understood my education. Pt had no further questions/concerns for provider/myself.  °

## 2022-03-06 NOTE — Discharge Instructions (Signed)
You came to the emergency department today to be evaluated for your left shoulder pain.  The x-ray obtained did not show any broken bones or dislocations.  I have placed you in a sling, please continue to use this until you can be followed up by the orthopedic provider listed on this paperwork.  Please call their office to schedule a follow-up appointment. ? ?While in the emergency department you received a Toradol injection.  Please do not take any NSAID medication including ibuprofen or Motrin for the next 24 hours.  After that you may take ibuprofen and Tylenol as listed below. ? ?Please take Ibuprofen (Advil, motrin) and Tylenol (acetaminophen) to relieve your pain.   ? ?You may take up to 600 MG (3 pills) of normal strength ibuprofen every 8 hours as needed.   ?You make take tylenol, up to 1,000 mg (two extra strength pills) every 8 hours as needed.  ? ?It is safe to take ibuprofen and tylenol at the same time as they work differently.  ? Do not take more than 3,000 mg tylenol in a 24 hour period (not more than one dose every 8 hours.  Please check all medication labels as many medications such as pain and cold medications may contain tylenol.  Do not drink alcohol while taking these medications.  Do not take other NSAID'S while taking ibuprofen (such as aleve or naproxen).  Please take ibuprofen with food to decrease stomach upset. ? ?Get help right away if: ?Your arm, hand, or fingers: ?Tingle. ?Become numb. ?Become swollen. ?Become painful. ?Turn white or blue. ?

## 2022-03-06 NOTE — ED Triage Notes (Signed)
Reports a post from a swing set hit him on top of his right shoulder.  ?10/10 pain. Limited ROM.  ? ?A/Ox4 ?Ambulatory in triage  ?

## 2022-03-06 NOTE — ED Provider Notes (Signed)
?[name] DEPT ?Provider Note ? ? ?CSN: [id number] ?Arrival date & time: 03/06/22  1057 ? ?  ? ?History ? ?Chief Complaint  ?Patient presents with  ? Shoulder Injury  ?  left  ? ? ?Jeffrey Wise is a 36 y.o. male with no pertinent past medical history.  Presents the emergency department with a chief complaint of left shoulder pain.  Patient states that today at approximately 10 AM he was taking down a playground when a wood 4 x 4 post struck his left shoulder.  Patient reports that he was knocked to the ground but denies hitting his head or any loss of consciousness.  Patient has pain to left shoulder since the accident.  Patient rates pain 10/10 on the pain scale.  Pain is worse with touch and range of motion.  Patient has not tried any modalities to alleviate his pain.  Patient is right-hand dominant. ? ?Denies any numbness, weakness, neck pain, back pain, saddle anesthesia, headache, visual disturbance, color change, pallor, wound ? ? ?Shoulder Injury ?Pertinent negatives include no chest pain, no abdominal pain, no headaches and no shortness of breath.  ? ?  ? ?Home Medications ?Prior to Admission medications   ?Not on File  ?   ? ?Allergies    ?Patient has no known allergies.   ? ?Review of Systems   ?Review of Systems  ?Constitutional:  Negative for chills and fever.  ?Eyes:  Negative for visual disturbance.  ?Respiratory:  Negative for shortness of breath.   ?Cardiovascular:  Negative for chest pain.  ?Gastrointestinal:  Negative for abdominal pain, nausea and vomiting.  ?Genitourinary:  Negative for difficulty urinating.  ?Musculoskeletal:  Negative for back pain and neck pain.  ?Skin:  Negative for color change and rash.  ?Neurological:  Negative for dizziness, syncope, weakness, light-headedness, numbness and headaches.  ?Psychiatric/Behavioral:  Negative for confusion.   ? ?Physical Exam ?Updated Vital Signs ?BP 129/81 (BP Location: Right Arm)   Pulse 81   Temp 98.3 ?F  (36.8 ?C) (Oral)   Resp 20   Ht '5\' 9"'$  (1.753 m)   Wt 70.8 kg   SpO2 98%   BMI 23.06 kg/m?  ?Physical Exam ?Vitals and nursing note reviewed.  ?Constitutional:   ?   General: He is not in acute distress. ?   Appearance: He is not ill-appearing, toxic-appearing or diaphoretic.  ?HENT:  ?   Head: Normocephalic.  ?Eyes:  ?   General: No scleral icterus.    ?   Right eye: No discharge.     ?   Left eye: No discharge.  ?Cardiovascular:  ?   Rate and Rhythm: Normal rate.  ?   Pulses:     ?     Radial pulses are 2+ on the right side and 2+ on the left side.  ?Pulmonary:  ?   Effort: Pulmonary effort is normal.  ?Musculoskeletal:  ?   Right shoulder: No swelling, deformity, effusion, laceration, tenderness, bony tenderness or crepitus. Normal range of motion.  ?   Left shoulder: Swelling, tenderness and bony tenderness present. No deformity, laceration or crepitus. Decreased range of motion.  ?   Left upper arm: Normal.  ?   Left elbow: No swelling, deformity, effusion or lacerations. Decreased range of motion. No tenderness.  ?   Left forearm: Normal.  ?   Left wrist: Normal.  ?   Left hand: No swelling, deformity, lacerations, tenderness or bony tenderness. Normal range of motion. Normal strength.  Normal sensation. Normal capillary refill.  ?   Comments: Contusion to left trapezius muscle.  Diffuse tenderness to left shoulder.  Decreased range of motion secondary to complaints of pain.  No deformity noted.  Patient has decreased range of motion to left elbow secondary to complaints of left shoulder pain. ? ?No midline tenderness or deformity to cervical, thoracic, lumbar spine  ?Skin: ?   General: Skin is warm and dry.  ?Neurological:  ?   General: No focal deficit present.  ?   Mental Status: He is alert.  ?Psychiatric:     ?   Behavior: Behavior is cooperative.  ? ? ?ED Results / Procedures / Treatments   ?Labs ?(all labs ordered are listed, but only abnormal results are displayed) ?Labs Reviewed - No data to  display ? ?EKG ?None ? ?Radiology ?DG Shoulder Left ? ?Result Date: 03/06/2022 ?CLINICAL DATA:  Left shoulder pain after a post from swing set hit him on top of his left shoulder. EXAM: LEFT SHOULDER - 2+ VIEW COMPARISON:  None. FINDINGS: There is no evidence of fracture or dislocation. There is no evidence of arthropathy or other focal bone abnormality. Soft tissues are unremarkable. IMPRESSION: Negative. Electronically Signed   By: Keane Police D.O.   On: 03/06/2022 12:13   ? ?Procedures ?Procedures  ? ? ?Medications Ordered in ED ?Medications - No data to display ? ?ED Course/ Medical Decision Making/ A&P ?  ?                        ?Medical Decision Making ?Amount and/or Complexity of Data Reviewed ?Radiology: ordered. ? ?Risk ?Prescription drug management. ? ? ?Alert 36 year old male in no acute distress, nontoxic-appearing.  Presents to the emergency department with a chief plaint of left shoulder pain after suffering an injury. ? ?Information obtained from patient and patient's significant other at bedside.  Past medical records were reviewed including previous provider notes. ? ?Patient has decreased range of motion and tenderness to left shoulder.  Pulse, motor, and sensation intact distally.  X-ray imaging obtained to look for acute osseous abnormality. ? ?I personally viewed interpret patient's x-ray imaging.  Imaging shows no acute osseous abnormality.   ? ?Suspect that patient's pain is musculoskeletal in nature.  We will give patient a shot of Toradol as well as place him in a sling.  Discussed symptomatic treatment with over-the-counter pain medication and ice.  Patient to follow-up with orthopedic provider in the outpatient setting. ? ?Based on patient's chief complaint, I considered admission might be necessary, however after reassuring ED workup feel patient is reasonable for discharge.  Discussed results, findings, treatment and follow up. Patient advised of return precautions. Patient verbalized  understanding and agreed with plan. ? ?Portions of this note were generated with [employer]. Dictation errors may occur despite best attempts at proofreading. ? ? ? ? ? ? ? ? ?Final Clinical Impression(s) / ED Diagnoses ?Final diagnoses:  ?None  ? ? ?Rx / DC Orders ?ED Discharge Orders   ? ? None  ? ?  ? ? ?  ?Loni Beckwith, PA-C ?03/06/22 1254 ? ?  ?Lucrezia Starch, MD ?03/07/22 [phone] ? ?
